# Patient Record
Sex: Female | Born: 1975 | Race: White | Hispanic: No | Marital: Married | Smoking: Former smoker
Health system: Southern US, Community
[De-identification: ages and names within clinical notes are randomized; demographics above are authoritative.]

## PROBLEM LIST (undated history)

## (undated) DIAGNOSIS — Z8489 Family history of other specified conditions: Secondary | ICD-10-CM

## (undated) DIAGNOSIS — F329 Major depressive disorder, single episode, unspecified: Secondary | ICD-10-CM

## (undated) DIAGNOSIS — F4310 Post-traumatic stress disorder, unspecified: Secondary | ICD-10-CM

## (undated) DIAGNOSIS — K219 Gastro-esophageal reflux disease without esophagitis: Secondary | ICD-10-CM

## (undated) DIAGNOSIS — R519 Headache, unspecified: Secondary | ICD-10-CM

## (undated) DIAGNOSIS — F32A Depression, unspecified: Secondary | ICD-10-CM

## (undated) DIAGNOSIS — D649 Anemia, unspecified: Secondary | ICD-10-CM

## (undated) DIAGNOSIS — R251 Tremor, unspecified: Secondary | ICD-10-CM

## (undated) DIAGNOSIS — I10 Essential (primary) hypertension: Secondary | ICD-10-CM

## (undated) HISTORY — PX: CHOLECYSTECTOMY: SHX55

---
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
## 2004-05-23 ENCOUNTER — Emergency Department: Payer: Self-pay | Admitting: Emergency Medicine

## 2004-06-18 ENCOUNTER — Emergency Department: Payer: Self-pay | Admitting: Emergency Medicine

## 2005-11-08 ENCOUNTER — Emergency Department: Payer: Self-pay | Admitting: Emergency Medicine

## 2006-09-01 ENCOUNTER — Emergency Department: Payer: Self-pay | Admitting: Emergency Medicine

## 2008-08-25 ENCOUNTER — Emergency Department: Payer: Self-pay | Admitting: Emergency Medicine

## 2008-10-19 ENCOUNTER — Emergency Department: Payer: Self-pay | Admitting: Unknown Physician Specialty

## 2008-12-31 ENCOUNTER — Inpatient Hospital Stay: Payer: Self-pay | Admitting: Psychiatry

## 2009-05-10 ENCOUNTER — Emergency Department: Payer: Self-pay | Admitting: Emergency Medicine

## 2009-05-13 ENCOUNTER — Emergency Department: Payer: Self-pay | Admitting: Internal Medicine

## 2009-10-18 ENCOUNTER — Emergency Department: Payer: Self-pay | Admitting: Internal Medicine

## 2009-11-11 ENCOUNTER — Emergency Department: Payer: Self-pay | Admitting: Emergency Medicine

## 2010-12-21 ENCOUNTER — Emergency Department: Payer: Self-pay | Admitting: Emergency Medicine

## 2012-11-01 ENCOUNTER — Emergency Department: Payer: Self-pay | Admitting: Emergency Medicine

## 2014-01-16 ENCOUNTER — Emergency Department: Payer: Self-pay | Admitting: Emergency Medicine

## 2014-01-16 LAB — BASIC METABOLIC PANEL
Anion Gap: 9 (ref 7–16)
BUN: 18 mg/dL (ref 7–18)
CREATININE: 0.86 mg/dL (ref 0.60–1.30)
Calcium, Total: 8.2 mg/dL — ABNORMAL LOW (ref 8.5–10.1)
Chloride: 110 mmol/L — ABNORMAL HIGH (ref 98–107)
Co2: 21 mmol/L (ref 21–32)
EGFR (Non-African Amer.): >60
Glucose: 91 mg/dL (ref 65–99)
Osmolality: 281 (ref 275–301)
Potassium: 3.5 mmol/L (ref 3.5–5.1)
Sodium: 140 mmol/L (ref 136–145)

## 2014-01-16 LAB — TROPONIN I: Troponin-I: <0.02 ng/mL

## 2014-01-16 LAB — CBC
HCT: 37.8 % (ref 35.0–47.0)
HGB: 12 g/dL (ref 12.0–16.0)
MCH: 29.1 pg (ref 26.0–34.0)
MCHC: 31.8 g/dL — ABNORMAL LOW (ref 32.0–36.0)
MCV: 92 fL (ref 80–100)
Platelet: 291 10*3/uL (ref 150–440)
RBC: 4.13 10*6/uL (ref 3.80–5.20)
RDW: 16.4 % — AB (ref 11.5–14.5)
WBC: 12.8 10*3/uL — ABNORMAL HIGH (ref 3.6–11.0)

## 2015-05-16 ENCOUNTER — Emergency Department
Admission: EM | Admit: 2015-05-16 | Discharge: 2015-05-16 | Disposition: A | Discharge status: Home / Self Care | Payer: Self-pay | Attending: Student | Admitting: Student

## 2015-05-16 ENCOUNTER — Emergency Department: Payer: Self-pay

## 2015-05-16 ENCOUNTER — Encounter: Payer: Self-pay | Admitting: Emergency Medicine

## 2015-05-16 DIAGNOSIS — F17200 Nicotine dependence, unspecified, uncomplicated: Secondary | ICD-10-CM | POA: Insufficient documentation

## 2015-05-16 DIAGNOSIS — M5412 Radiculopathy, cervical region: Secondary | ICD-10-CM | POA: Insufficient documentation

## 2015-05-16 MED ORDER — PREDNISONE 20 MG PO TABS
60.0000 mg | ORAL_TABLET | Freq: Once | ORAL | Status: AC
  Administered 2015-05-16: 60 mg via ORAL
  Filled 2015-05-16: qty 3

## 2015-05-16 MED ORDER — TRAMADOL HCL 50 MG PO TABS: 50.0000 mg | ORAL_TABLET | Freq: Four times a day (QID) | ORAL | Status: AC | PRN

## 2015-05-16 MED ORDER — METHYLPREDNISOLONE 4 MG PO TBPK: ORAL_TABLET | ORAL | Status: DC

## 2015-05-16 MED ORDER — TRAMADOL HCL 50 MG PO TABS
50.0000 mg | ORAL_TABLET | Freq: Once | ORAL | Status: AC
  Administered 2015-05-16: 50 mg via ORAL
  Filled 2015-05-16: qty 1

## 2015-05-16 NOTE — ED Provider Notes (Signed)
[facility] Emergency Department Provider Note  ____________________________________________  Time seen: Approximately 1:57 PM  I have reviewed the triage vital signs and the nursing notes.   HISTORY  Chief Complaint Neck Pain    HPI Misty George is a 40 y.o. female patient complain of neck pain that radiates to her left upper extremity. Patient stated pain increases with raising the arm and also right lateral movements of the neck. Patient also stated decreased strength in the left upper extremity. Patient stated no provocative incident accounting for this pain. Patient stated duration neck pain is greater than 3 months. But the radicular component started in the last 3 days. Patient has been using over-the-counter pain patches for her neck pain for at least 2 months. Patient described the pain as a shooting sensation. Patient states at present no pain secondary to wearing lidocaine pain patch. History reviewed. No pertinent past medical history.  There are no active problems to display for this patient.   History reviewed. No pertinent past surgical history.  Current Outpatient Rx  Name  Route  Sig  Dispense  Refill  . methylPREDNISolone (MEDROL DOSEPAK) 4 MG TBPK tablet      Take Tapered dose as directed   21 tablet   0   . traMADol (ULTRAM) 50 MG tablet   Oral   Take 1 tablet (50 mg total) by mouth every 6 (six) hours as needed.   20 tablet   0     Allergies Amoxicillin  History reviewed. No pertinent family history.  Social History Social History  Substance Use Topics  . Smoking status: Current Every Day Smoker  . Smokeless tobacco: None  . Alcohol Use: No    Review of Systems Constitutional: No fever/chills Eyes: No visual changes. ENT: No sore throat. Cardiovascular: Denies chest pain. Respiratory: Denies shortness of breath. Gastrointestinal: No abdominal pain.  No nausea, no vomiting.  No diarrhea.  No  constipation. Genitourinary: Negative for dysuria. Musculoskeletal: Radicular neck pain  Skin: Negative for rash. Neurological: Negative for headaches, focal weakness or numbness. Hematological/Lymphatic: Allergic/Immunilogical: Amoxicillin  10-point ROS otherwise negative.  ____________________________________________   PHYSICAL EXAM:  VITAL SIGNS: ED Triage Vitals  Enc Vitals Group     BP 05/16/15 1253 106/80 mmHg     Pulse Rate 05/16/15 1253 82     Resp 05/16/15 1253 18     Temp 05/16/15 1253 98.2 F (36.8 C)     Temp Source 05/16/15 1253 Oral     SpO2 05/16/15 1253 99 %     Weight --      Height --      Head Cir --      Peak Flow --      Pain Score 05/16/15 1253 0     Pain Loc --      Pain Edu? --      Excl. in [place]? --     Constitutional: Alert and oriented. Well appearing and in no acute distress. Eyes: Conjunctivae are normal. PERRL. EOMI. Head: Atraumatic. Nose: No congestion/rhinnorhea. Mouth/Throat: Mucous membranes are moist.  Oropharynx non-erythematous. Neck: No stridor.  No guarding with palpation C3-C5. Cardiovascular: Normal rate, regular rhythm. Grossly normal heart sounds.  Good peripheral circulation. Respiratory: Normal respiratory effort.  No retractions. Lungs CTAB. Gastrointestinal: Soft and nontender. No distention. No abdominal bruits. No CVA tenderness. Musculoskeletal: Decreased range of motion with abduction and overhead reaching the left upper extremity Neurologic:  Normal speech and language. No gross focal neurologic deficits are appreciated.  No gait instability. Skin:  Skin is warm, dry and intact. No rash noted. Psychiatric: Mood and affect are normal. Speech and behavior are normal.  ____________________________________________   LABS (all labs ordered are listed, but only abnormal results are displayed)  Labs Reviewed - No data to  display ____________________________________________  EKG   ____________________________________________  RADIOLOGY  X-rays show disc space loss between L5 and L6 was in plate spurring. ____________________________________________   PROCEDURES  Procedure(s) performed: None  Critical Care performed: No  ____________________________________________   INITIAL IMPRESSION / ASSESSMENT AND PLAN / ED COURSE  Pertinent labs & imaging results that were available during my care of the patient were reviewed by me and considered in my medical decision making (see chart for details).  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 Discussed x-ray findings with patient. Patient placed in arm sling. Patient will follow orthopedics. Patient given prescription for Medrol Dosepak and tramadol. ____________________________________________   FINAL CLINICAL IMPRESSION(S) / ED DIAGNOSES  Final diagnoses:  Cervical radicular pain      Sable Feil, PA-C 05/16/15 1433  Joanne Gavel, MD 05/16/15 1620

## 2015-05-16 NOTE — Discharge Instructions (Signed)
Cervical Radiculopathy Cervical radiculopathy means that a nerve in the neck is pinched or bruised. This can cause pain or loss of feeling (numbness) that runs from your neck to your arm and fingers. HOME CARE: Wear sling until evaluation by orthopedic Dr. Managing Pain  Take over-the-counter and prescription medicines only as told by your doctor.  If directed, put ice on the injured or painful area.  Put ice in a plastic bag.  Place a towel between your skin and the bag.  Leave the ice on for 20 minutes, 2-3 times per day.  If ice does not help, you can try using heat. Take a warm shower or warm bath, or use a heat pack as told by your doctor.  You may try a gentle neck and shoulder massage. Activity  Rest as needed. Follow instructions from your doctor about any activities to avoid.  Do exercises as told by your doctor or physical therapist. General Instructions   If you were given a soft collar, wear it as told by your doctor.  Use a flat pillow when you sleep.  Keep all follow-up visits as told by your doctor. This is important. GET HELP IF:  Your condition does not improve with treatment. GET HELP RIGHT AWAY IF:   Your pain gets worse and is not controlled with medicine.  You lose feeling or feel weak in your hand, arm, face, or leg.  You have a fever.  You have a stiff neck.  You cannot control when you poop or pee (have incontinence).  You have trouble with walking, balance, or talking.   This information is not intended to replace advice given to you by your health care provider. Make sure you discuss any questions you have with your health care provider.   Document Released: 01/21/2011 Document Revised: 10/23/2014 Document Reviewed: 03/28/2014 Elsevier Interactive Patient Education [employer].

## 2015-05-16 NOTE — ED Notes (Signed)
Pt to ed with c/o left sided neck pain and left arm pain that started this am.  Pt reports increased pain with ROM of left arm and left shoulder.  Pt also reports increased pain with ROM in neck.

## 2015-05-16 NOTE — ED Notes (Signed)
Numbness in left arm c/o sharp pain in her neck.  Also when she raises her are she has pain.  She denies any injury.  Pain in the same area any way that she turns her head.

## 2015-07-02 ENCOUNTER — Encounter: Payer: Self-pay | Admitting: Emergency Medicine

## 2015-07-02 ENCOUNTER — Emergency Department
Admission: EM | Admit: 2015-07-02 | Discharge: 2015-07-02 | Disposition: A | Discharge status: Home / Self Care | Payer: Self-pay | Attending: Emergency Medicine | Admitting: Emergency Medicine

## 2015-07-02 ENCOUNTER — Emergency Department: Payer: Self-pay

## 2015-07-02 DIAGNOSIS — R112 Nausea with vomiting, unspecified: Secondary | ICD-10-CM | POA: Insufficient documentation

## 2015-07-02 DIAGNOSIS — R197 Diarrhea, unspecified: Secondary | ICD-10-CM | POA: Insufficient documentation

## 2015-07-02 DIAGNOSIS — Z9049 Acquired absence of other specified parts of digestive tract: Secondary | ICD-10-CM | POA: Insufficient documentation

## 2015-07-02 DIAGNOSIS — F17200 Nicotine dependence, unspecified, uncomplicated: Secondary | ICD-10-CM | POA: Insufficient documentation

## 2015-07-02 DIAGNOSIS — R1013 Epigastric pain: Secondary | ICD-10-CM | POA: Insufficient documentation

## 2015-07-02 LAB — URINALYSIS COMPLETE WITH MICROSCOPIC (ARMC ONLY)
BACTERIA UA: NONE SEEN
Bilirubin Urine: NEGATIVE
GLUCOSE, UA: NEGATIVE mg/dL
HGB URINE DIPSTICK: NEGATIVE
Ketones, ur: NEGATIVE mg/dL
LEUKOCYTES UA: NEGATIVE
Nitrite: NEGATIVE
Protein, ur: NEGATIVE mg/dL
SPECIFIC GRAVITY, URINE: 1.002 — AB (ref 1.005–1.030)
pH: 6 (ref 5.0–8.0)

## 2015-07-02 LAB — CBC
HEMATOCRIT: 39.8 % (ref 35.0–47.0)
HEMOGLOBIN: 13.4 g/dL (ref 12.0–16.0)
MCH: 30.2 pg (ref 26.0–34.0)
MCHC: 33.8 g/dL (ref 32.0–36.0)
MCV: 89.5 fL (ref 80.0–100.0)
Platelets: 256 10*3/uL (ref 150–440)
RBC: 4.44 MIL/uL (ref 3.80–5.20)
RDW: 15.6 % — ABNORMAL HIGH (ref 11.5–14.5)
WBC: 10.1 10*3/uL (ref 3.6–11.0)

## 2015-07-02 LAB — COMPREHENSIVE METABOLIC PANEL
ALT: 17 U/L (ref 14–54)
AST: 17 U/L (ref 15–41)
Albumin: 4.1 g/dL (ref 3.5–5.0)
Alkaline Phosphatase: 50 U/L (ref 38–126)
Anion gap: 6 (ref 5–15)
BUN: 14 mg/dL (ref 6–20)
CHLORIDE: 110 mmol/L (ref 101–111)
CO2: 24 mmol/L (ref 22–32)
Calcium: 9.3 mg/dL (ref 8.9–10.3)
Creatinine, Ser: 0.51 mg/dL (ref 0.44–1.00)
Glucose, Bld: 83 mg/dL (ref 65–99)
POTASSIUM: 4 mmol/L (ref 3.5–5.1)
SODIUM: 140 mmol/L (ref 135–145)
Total Bilirubin: 0.3 mg/dL (ref 0.3–1.2)
Total Protein: 6.8 g/dL (ref 6.5–8.1)

## 2015-07-02 LAB — LIPASE, BLOOD: LIPASE: 20 U/L (ref 11–51)

## 2015-07-02 LAB — PREGNANCY, URINE: PREG TEST UR: NEGATIVE

## 2015-07-02 MED ORDER — OMEPRAZOLE 20 MG PO CPDR: 20.0000 mg | DELAYED_RELEASE_CAPSULE | Freq: Every day | ORAL | Status: DC

## 2015-07-02 MED ORDER — ONDANSETRON HCL 4 MG/2ML IJ SOLN
4.0000 mg | Freq: Once | INTRAMUSCULAR | Status: AC
  Administered 2015-07-02: 4 mg via INTRAVENOUS
  Filled 2015-07-02: qty 2

## 2015-07-02 MED ORDER — FAMOTIDINE 20 MG PO TABS
40.0000 mg | ORAL_TABLET | Freq: Once | ORAL | Status: AC
  Administered 2015-07-02: 40 mg via ORAL
  Filled 2015-07-02: qty 2

## 2015-07-02 MED ORDER — MORPHINE SULFATE (PF) 4 MG/ML IV SOLN
4.0000 mg | Freq: Once | INTRAVENOUS | Status: AC
  Administered 2015-07-02: 4 mg via INTRAVENOUS
  Filled 2015-07-02: qty 1

## 2015-07-02 MED ORDER — SODIUM CHLORIDE 0.9 % IV BOLUS (SEPSIS)
1000.0000 mL | Freq: Once | INTRAVENOUS | Status: AC
  Administered 2015-07-02: 1000 mL via INTRAVENOUS
  Filled 2015-07-02: qty 1000

## 2015-07-02 MED ORDER — GI COCKTAIL ~~LOC~~
30.0000 mL | ORAL | Status: AC
  Administered 2015-07-02: 30 mL via ORAL
  Filled 2015-07-02: qty 30

## 2015-07-02 MED ORDER — IOPAMIDOL (ISOVUE-300) INJECTION 61%
100.0000 mL | Freq: Once | INTRAVENOUS | Status: AC | PRN
  Administered 2015-07-02: 100 mL via INTRAVENOUS

## 2015-07-02 MED ORDER — METOCLOPRAMIDE HCL 10 MG PO TABS: 10.0000 mg | ORAL_TABLET | Freq: Three times a day (TID) | ORAL | Status: DC

## 2015-07-02 MED ORDER — ALUMINUM-MAGNESIUM-SIMETHICONE 200-200-20 MG/5ML PO SUSP: 30.0000 mL | Freq: Three times a day (TID) | ORAL | Status: DC

## 2015-07-02 MED ORDER — DIATRIZOATE MEGLUMINE & SODIUM 66-10 % PO SOLN
15.0000 mL | Freq: Once | ORAL | Status: AC
  Administered 2015-07-02: 15 mL via ORAL

## 2015-07-02 NOTE — Discharge Instructions (Signed)

## 2015-07-02 NOTE — ED Provider Notes (Signed)
[facility] Emergency Department Provider Note  ____________________________________________  Time seen: 10:20 AM  I have reviewed the triage vital signs and the nursing notes.   HISTORY  Chief Complaint Abdominal Pain    HPI Misty George is a 40 y.o. female who complains of epigastric pain with nausea vomiting diarrhea that started about 2 or 3 days ago. She is been treating this at home with Zantac without relief. 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 She recently moved to this area and this made an appointment with primary care for one month from now to establish care. She denies chest pain shortness of breath dizziness or syncope. No exertional or pleuritic symptoms. No fevers or chills.Pain is colicky in moderate intensity.  Difficulty with eating over the last few days due to the pain which gets worse when she eats.   History reviewed. No pertinent past medical history. Peptic ulcer disease  There are no active problems to display for this patient.    Past Surgical History  Procedure Laterality Date  . Cholecystectomy       Current Outpatient Rx  Name  Route  Sig  Dispense  Refill  . aluminum-magnesium hydroxide-simethicone (MAALOX) [id number] MG/5ML SUSP   Oral   Take 30 mLs by mouth 4 (four) times daily -  before meals and at bedtime.   355 mL   0   . methylPREDNISolone (MEDROL DOSEPAK) 4 MG TBPK tablet      Take Tapered dose as directed   21 tablet   0   . metoCLOPramide (REGLAN) 10 MG tablet   Oral   Take 1 tablet (10 mg total) by mouth 4 (four) times daily -  before meals and at bedtime.   60 tablet   0   . omeprazole (PRILOSEC) 20 MG capsule   Oral   Take 1 capsule (20 mg total) by mouth daily.   30 capsule   1   . traMADol (ULTRAM) 50 MG tablet   Oral   Take 1 tablet (50 mg total) by mouth every 6 (six) hours as needed.   20 tablet   0       Allergies Amoxicillin   No family history on file.  Social History Social History  Substance Use Topics  . Smoking status: Current Every Day Smoker  . Smokeless tobacco: None  . Alcohol Use: No    Review of Systems  Constitutional:   No fever or chills.  Eyes:   No vision changes.  ENT:   No sore throat. No rhinorrhea. Cardiovascular:   No chest pain. Respiratory:   No dyspnea or cough. Gastrointestinal:   Positive epigastric pain with vomiting and diarrhea as above.  Genitourinary:   Negative for dysuria or difficulty urinating. Musculoskeletal:   Negative for focal pain or swelling Neurological:   Negative for headaches 10-point ROS otherwise negative.  ____________________________________________   PHYSICAL EXAM:  VITAL SIGNS: ED Triage Vitals  Enc Vitals Group     BP 07/02/15 0945 111/79 mmHg     Pulse Rate 07/02/15 0945 97     Resp 07/02/15 0945 18     Temp 07/02/15 0945 97.8 F (36.6 C)     Temp Source 07/02/15 0945 Oral     SpO2 07/02/15 0945 100 %     Weight 07/02/15 0945 155 lb (70.308 kg)     Height 07/02/15 0945 5\' 3"  (1.6 m)     Head  Cir --      Peak Flow --      Pain Score 07/02/15 0945 7     Pain Loc --      Pain Edu? --      Excl. in [place]? --     Vital signs reviewed, nursing assessments reviewed.   Constitutional:   Alert and oriented. Well appearing and in no distress. Eyes:   No scleral icterus. No conjunctival pallor. PERRL. EOMI.  No nystagmus. ENT   Head:   Normocephalic and atraumatic.   Nose:   No congestion/rhinnorhea. No septal hematoma   Mouth/Throat:   MMM, no pharyngeal erythema. No peritonsillar mass.    Neck:   No stridor. No SubQ emphysema. No meningismus. Hematological/Lymphatic/Immunilogical:   No cervical lymphadenopathy. Cardiovascular:   RRR. Symmetric bilateral radial and DP pulses.  No murmurs.  Respiratory:   Normal respiratory effort without tachypnea nor retractions. Breath sounds are clear and  equal bilaterally. No wheezes/rales/rhonchi. Gastrointestinal:   Soft With epigastric and right lower quadrant tenderness. Non distended. There is no CVA tenderness.  No rebound, rigidity, or guarding. Genitourinary:   deferred Musculoskeletal:   Nontender with normal range of motion in all extremities. No joint effusions.  No lower extremity tenderness.  No edema. Neurologic:   Normal speech and language.  CN 2-10 normal. Motor grossly intact. No gross focal neurologic deficits are appreciated.  Skin:    Skin is warm, dry and intact. No rash noted.  No petechiae, purpura, or bullae.  ____________________________________________    LABS (pertinent positives/negatives) (all labs ordered are listed, but only abnormal results are displayed) Labs Reviewed  CBC - Abnormal; Notable for the following:    RDW 15.6 (*)    All other components within normal limits  URINALYSIS COMPLETEWITH MICROSCOPIC ([facility] ONLY) - Abnormal; Notable for the following:    Color, Urine STRAW (*)    APPearance CLEAR (*)    Specific Gravity, Urine 1.002 (*)    Squamous Epithelial / LPF 0-5 (*)    All other components within normal limits  LIPASE, BLOOD  COMPREHENSIVE METABOLIC PANEL  PREGNANCY, URINE   ____________________________________________   EKG  Interpreted by me  Date: 07/02/2015  Rate: 98  Rhythm: normal sinus rhythm  QRS Axis: normal  Intervals: normal  ST/T Wave abnormalities: normal  Conduction Disutrbances: none  Narrative Interpretation: unremarkable      ____________________________________________    RADIOLOGY  CT abdomen and pelvis unremarkable  ____________________________________________   PROCEDURES   ____________________________________________   INITIAL IMPRESSION / ASSESSMENT AND PLAN / ED COURSE  Pertinent labs & imaging results that were available during my care of the patient were reviewed by me and considered in my medical decision making (see chart for  details).  Patient presents with epigastric pain, concerning for pancreatitis versus gastritis. Low suspicion for perforation or obstruction, torsion STI PID. Labs were unremarkable so I proceeded with CT scan which was also negative. Patient was feeling better after GI cocktail and antacids. She is tolerating oral intake. Vital signs are stable. We'll discharge her home to follow up with primary care, omeprazole Reglan and Maalox for now.     ____________________________________________   FINAL CLINICAL IMPRESSION(S) / ED DIAGNOSES  Final diagnoses:  Epigastric pain       Portions of this note were generated with dragon dictation software. Dictation errors may occur despite best attempts at proofreading.   Carrie Mew, MD 07/02/15 [phone]

## 2015-07-02 NOTE — ED Notes (Signed)
Epigastric abd pain x4 days, vomiting

## 2015-07-02 NOTE — ED Notes (Signed)
Pt. Reports Medial Upper epigastric pain associated with N/V/D since Sunday. Pt. Has been tx at home with OTC without relief. Pt. Reports a brown or red tinge emesis. Pt. Able to tolerate minimal soup and fluids.

## 2018-07-27 ENCOUNTER — Emergency Department
Admission: EM | Admit: 2018-07-27 | Discharge: 2018-07-27 | Disposition: A | Discharge status: Home / Self Care | Payer: BC Managed Care – PPO | Attending: Emergency Medicine | Admitting: Emergency Medicine

## 2018-07-27 ENCOUNTER — Encounter: Payer: Self-pay | Admitting: Emergency Medicine

## 2018-07-27 ENCOUNTER — Emergency Department: Payer: BC Managed Care – PPO

## 2018-07-27 ENCOUNTER — Other Ambulatory Visit: Payer: Self-pay

## 2018-07-27 DIAGNOSIS — Y9389 Activity, other specified: Secondary | ICD-10-CM | POA: Insufficient documentation

## 2018-07-27 DIAGNOSIS — W540XXA Bitten by dog, initial encounter: Secondary | ICD-10-CM | POA: Diagnosis not present

## 2018-07-27 DIAGNOSIS — S61452A Open bite of left hand, initial encounter: Secondary | ICD-10-CM

## 2018-07-27 DIAGNOSIS — S81051A Open bite, right knee, initial encounter: Secondary | ICD-10-CM

## 2018-07-27 DIAGNOSIS — F17200 Nicotine dependence, unspecified, uncomplicated: Secondary | ICD-10-CM | POA: Insufficient documentation

## 2018-07-27 DIAGNOSIS — Y92008 Other place in unspecified non-institutional (private) residence as the place of occurrence of the external cause: Secondary | ICD-10-CM | POA: Insufficient documentation

## 2018-07-27 DIAGNOSIS — Y999 Unspecified external cause status: Secondary | ICD-10-CM | POA: Insufficient documentation

## 2018-07-27 DIAGNOSIS — S51851A Open bite of right forearm, initial encounter: Secondary | ICD-10-CM

## 2018-07-27 DIAGNOSIS — S51831A Puncture wound without foreign body of right forearm, initial encounter: Secondary | ICD-10-CM | POA: Diagnosis not present

## 2018-07-27 DIAGNOSIS — Z79899 Other long term (current) drug therapy: Secondary | ICD-10-CM | POA: Diagnosis not present

## 2018-07-27 DIAGNOSIS — S81031A Puncture wound without foreign body, right knee, initial encounter: Secondary | ICD-10-CM | POA: Insufficient documentation

## 2018-07-27 DIAGNOSIS — S61431A Puncture wound without foreign body of right hand, initial encounter: Secondary | ICD-10-CM | POA: Diagnosis not present

## 2018-07-27 MED ORDER — METRONIDAZOLE 500 MG PO TABS: 500.0000 mg | ORAL_TABLET | Freq: Four times a day (QID) | ORAL | 0 refills | Status: DC

## 2018-07-27 MED ORDER — CIPROFLOXACIN HCL 500 MG PO TABS
500.0000 mg | ORAL_TABLET | Freq: Once | ORAL | Status: AC
  Administered 2018-07-27: 500 mg via ORAL
  Filled 2018-07-27: qty 1

## 2018-07-27 MED ORDER — HYDROCODONE-ACETAMINOPHEN 5-325 MG PO TABS
1.0000 | ORAL_TABLET | Freq: Once | ORAL | Status: AC
  Administered 2018-07-27: 1 via ORAL
  Filled 2018-07-27: qty 1

## 2018-07-27 MED ORDER — CIPROFLOXACIN HCL 500 MG PO TABS: 500.0000 mg | ORAL_TABLET | Freq: Two times a day (BID) | ORAL | 0 refills | Status: AC

## 2018-07-27 MED ORDER — NAPROXEN 500 MG PO TABS
500.0000 mg | ORAL_TABLET | Freq: Once | ORAL | Status: AC
  Administered 2018-07-27: 500 mg via ORAL
  Filled 2018-07-27: qty 1

## 2018-07-27 MED ORDER — METRONIDAZOLE IN NACL 5-0.79 MG/ML-% IV SOLN: 500.0000 mg | Freq: Once | INTRAVENOUS | Status: DC

## 2018-07-27 MED ORDER — HYDROCODONE-ACETAMINOPHEN 5-325 MG PO TABS: 1.0000 | ORAL_TABLET | ORAL | 0 refills | Status: DC | PRN

## 2018-07-27 MED ORDER — METRONIDAZOLE 500 MG PO TABS
500.0000 mg | ORAL_TABLET | Freq: Once | ORAL | Status: AC
  Administered 2018-07-27: 500 mg via ORAL
  Filled 2018-07-27: qty 1

## 2018-07-27 MED ORDER — NAPROXEN 500 MG PO TABS: 500.0000 mg | ORAL_TABLET | Freq: Two times a day (BID) | ORAL | 0 refills | Status: DC

## 2018-07-27 NOTE — Discharge Instructions (Addendum)
Keep the wounds clean and dry.  The Steri-Strips will fall off on their own.  Do not apply any ointments while they are in place.  Take the antibiotic as prescribed and until finished.  Return to the emergency department if you feel that there is any infection.

## 2018-07-27 NOTE — ED Triage Notes (Signed)
Pt reports she was attacked by neighbors dog this evening leaving 3 puncture wounds to the right knee/upper thigh, 1 puncture to the right forearm, 1 puncture to the left thumb and multiple abrasions to the same areas. Pt sts attack was reported to local [place] department.

## 2018-07-27 NOTE — ED Provider Notes (Signed)
[facility] Emergency Department Provider Note  ____________________________________________  Time seen: Approximately 8:15 PM  I have reviewed the triage vital signs and the nursing notes.   HISTORY  Chief Complaint Animal Bite   HPI Misty George is a 43 y.o. female who presents to the emergency department after being bitten by the neighbor's dog.  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  She states that today the dog broke free from its chain and as she was taking her dog inside it decided to attack.  She states she has a great Dane.  She jumped on her dogs back try  and get away, but the dog bit her.  She states that she feels as if her right knee is "crushed."  She also states that she has wounds to the right and left upper extremities.  She also reports that she had a nosebleed but is not sure exactly how that happened.  Animal control is involved.  Tdap is current.  History reviewed. No pertinent past medical history.  There are no active problems to display for this patient.   Past Surgical History:  Procedure Laterality Date  . CHOLECYSTECTOMY      Prior to Admission medications   Medication Sig Start Date End Date Taking? Authorizing Provider  aluminum-magnesium hydroxide-simethicone (MAALOX) 563-149-70 MG/5ML SUSP Take 30 mLs by mouth 4 (four) times daily -  before meals and at bedtime. 07/02/15   Carrie Mew, MD  ciprofloxacin (CIPRO) 500 MG tablet Take 1 tablet (500 mg total) by mouth 2 (two) times daily for 10 days. 07/27/18 08/06/18  Omayra Tulloch, Johnette Abraham B, FNP  HYDROcodone-acetaminophen (NORCO/VICODIN) 5-325 MG tablet Take 1 tablet by mouth every 4 (four) hours as needed for moderate pain. 07/27/18 07/27/19  Sherrie George B, FNP  methylPREDNISolone (MEDROL DOSEPAK) 4 MG TBPK tablet Take Tapered dose as directed 05/16/15   Sable Feil, PA-C  metoCLOPramide (REGLAN) 10 MG tablet Take 1 tablet (10 mg total) by  mouth 4 (four) times daily -  before meals and at bedtime. 07/02/15   Carrie Mew, MD  metroNIDAZOLE (FLAGYL) 500 MG tablet Take 1 tablet (500 mg total) by mouth 4 (four) times daily. 07/27/18   Teosha Casso, Johnette Abraham B, FNP  naproxen (NAPROSYN) 500 MG tablet Take 1 tablet (500 mg total) by mouth 2 (two) times daily with a meal. 07/27/18   Dontea Corlew B, FNP  omeprazole (PRILOSEC) 20 MG capsule Take 1 capsule (20 mg total) by mouth daily. 07/02/15 07/01/16  Carrie Mew, MD    Allergies Amoxicillin  History reviewed. No pertinent family history.  Social History Social History   Tobacco Use  . Smoking status: Current Every Day Smoker  . Smokeless tobacco: Never Used  Substance Use Topics  . Alcohol use: No  . Drug use: No    Review of Systems  Constitutional: Negative for fever. Respiratory: Negative for cough or shortness of breath.  Musculoskeletal: Negative for myalgias Skin: Positive for puncture wounds and abrasions to the extremities. Neurological: Negative for numbness or paresthesias. ____________________________________________   PHYSICAL EXAM:  VITAL SIGNS: ED Triage Vitals [07/27/18 1910]  Enc Vitals Group     BP 124/84     Pulse Rate (!) 133     Resp 20     Temp 98 F (36.7 C)     Temp Source Oral     SpO2 97 %     Weight      Height  Head Circumference      Peak Flow      Pain Score      Pain Loc      Pain Edu?      Excl. in [place]?      Constitutional: Well appearing. Eyes: Conjunctivae are clear without discharge or drainage. Nose: No rhinorrhea noted. Mouth/Throat: Airway is patent.  Neck: No stridor. Unrestricted range of motion observed. Cardiovascular: Capillary refill is <3 seconds.  Respiratory: Respirations are even and unlabored.. Musculoskeletal: Right knee pain increases with attempt at full extension.  No obvious deformity. Neurologic: Awake, alert, and oriented x 4.  Skin: Laceration/puncture wound noted to the medial and  lateral aspect of the right knee, right forearm, and right hand.  Scattered abrasions and early ecchymosis is noted to all extremities.  ____________________________________________   LABS (all labs ordered are listed, but only abnormal results are displayed)  Labs Reviewed - No data to display ____________________________________________  EKG  Not indicated. ____________________________________________  RADIOLOGY  Images of the right knee shows no osseous abnormality or radiopaque foreign body per radiology. ____________________________________________   PROCEDURES  Procedures  Wounds of the right forearm, left hand, and right knee were all cleaned with Hibiclens and normal saline.  Steri-Strips were applied to the gaping wounds on the left left hand and right knee. ____________________________________________   INITIAL IMPRESSION / ASSESSMENT AND PLAN / ED COURSE  Misty George is a 43 y.o. female presenting to the emergency department after being bitten by the neighbor's dog.   X-ray of the right knee is reassuring and negative for bony abnormality or retained foreign body.  Wounds were cleaned with Hibiclens and normal saline as described above.  Patient has an anaphylactic reaction to amoxicillin, therefore she will be treated with ciprofloxacin and Flagyl.  She will also be given Naprosyn and hydrocodone for pain control.  Patient was strongly advised to return to the emergency department if she begins to feel that she has any sign of infection from the wounds.  She was also advised to keep in touch with animal control and return to the emergency department if they deem it necessary for her to have the rabies series initiated.  Medications  HYDROcodone-acetaminophen (NORCO/VICODIN) 5-325 MG per tablet 1 tablet (1 tablet Oral Given 07/27/18 2041)  naproxen (NAPROSYN) tablet 500 mg (500 mg Oral Given 07/27/18 2041)  ciprofloxacin (CIPRO) tablet 500 mg (500 mg Oral Given  07/27/18 2159)  metroNIDAZOLE (FLAGYL) tablet 500 mg (500 mg Oral Given 07/27/18 2159)     Pertinent labs & imaging results that were available during my care of the patient were reviewed by me and considered in my medical decision making (see chart for details).  ____________________________________________   FINAL CLINICAL IMPRESSION(S) / ED DIAGNOSES  Final diagnoses:  Dog bite of right knee, initial encounter  Dog bite of right forearm, initial encounter  Dog bite of left hand, initial encounter    ED Discharge Orders         Ordered    HYDROcodone-acetaminophen (NORCO/VICODIN) 5-325 MG tablet  Every 4 hours PRN     07/27/18 2141    naproxen (NAPROSYN) 500 MG tablet  2 times daily with meals     07/27/18 2141    ciprofloxacin (CIPRO) 500 MG tablet  2 times daily     07/27/18 2145    metroNIDAZOLE (FLAGYL) 500 MG tablet  4 times daily     07/27/18 2145           Note:  This document was prepared using Dragon voice recognition software and may include unintentional dictation errors.   Victorino Dike, FNP 07/29/18 0028    Schuyler Amor, MD 08/02/18 [phone]

## 2018-08-21 NOTE — H&P (Signed)
Misty George is a 43 y.o. female here for [place] . MistySatterfieldis a 43 y.o.female.pt here for f/up  for AUB . Pt states that she has a long h/o irregular bleeding and bleeds 2x/ week . + clot passage . Long h/o of dyspareunia . Marland Kitchen She was placed on OCPs for this bleeding one month ago without change in bleeding .She does not desire future fertility  Ferritin= 19   embx -proliferative   pap = pending  Saline infusion u/s  : Two fibroids seen 1 lt ant=35 mm 2 rt ant= 17 mm   Endometrium=10.23 mm   Rt ov wnl  Lt septated ov cyst= 1.99 cm Septation=0.17 cm  Saline infusion sonohysterography: betadine prep to the cervix followed by placement of the HSG catheter into the endometrial canal . Sterile H2O is injected while performing a transvaginal u/s . Findings:no endometrial masses .    Past Medical History:  has a past medical history of Allergy and Anxiety.  Past Surgical History:  has a past surgical history that includes Cholecystectomy and Laparoscopic cholecystectomy. Family History: family history includes Alcohol abuse in her maternal grandfather and paternal grandfather; Bipolar disorder in her mother; Depression in her maternal grandmother; High blood pressure (Hypertension) in her father, maternal grandmother, and paternal grandmother; Hip fracture in her maternal grandmother and paternal grandmother; Hyperlipidemia (Elevated cholesterol) in her mother; Myocardial Infarction (Heart attack) in her maternal grandfather, maternal grandmother, and paternal grandmother; Osteoporosis (Thinning of bones) in her mother; Rheum arthritis in her father; Stroke in her maternal grandmother and paternal grandmother. Social History:  reports that she quit smoking about 3 years ago. Her smoking use included cigarettes. She uses smokeless tobacco. She reports current alcohol use. She reports previous drug use. OB/GYN History:          OB History    Gravida  1   Para  1    Term      Preterm      AB      Living  1     SAB      TAB      Ectopic      Molar      Multiple      Live Births  1          Allergies: is allergic to amoxicillin and meloxicam. Medications:  Current Outpatient Medications:  .  cinnamon bark 500 mg capsule, Take 1,000 mg by mouth 2 (two) times daily, Disp: , Rfl:  .  cyanocobalamin (VITAMIN B12) 1000 MCG tablet, Take 1 tablet (1,000 mcg total) by mouth once daily, Disp: 30 tablet, Rfl: 11 .  cyclobenzaprine (FLEXERIL) 5 MG tablet, Take 1 tablet (5 mg total) by mouth 2 (two) times daily as needed for Muscle spasms, Disp: 30 tablet, Rfl: 0 .  docusate (COLACE) 100 MG capsule, Take 50 mg by mouth once daily, Disp: , Rfl:  .  escitalopram oxalate (LEXAPRO) 10 MG tablet, Take 1 tablet (10 mg total) by mouth once daily for 90 days, Disp: 30 tablet, Rfl: 2 .  gabapentin (NEURONTIN) 100 MG capsule, Take one capsule ( 100 mg) twice a day for one week then increase to two capsules ( 200 mg) twice a day for tremor., Disp: 120 capsule, Rfl: 3 .  milk thistle 175 mg tablet, Take 175 mg by mouth once daily, Disp: , Rfl:  .  omeprazole (PRILOSEC) 20 MG DR capsule, Take 20 mg by mouth once daily, Disp: , Rfl:  .  phentermine (  ADIPEX-P) 37.5 mg tablet, Take 1 tablet (37.5 mg total) by mouth every morning before breakfast, Disp: 30 tablet, Rfl: 0 .  norgestimate-ethinyl estradioL (SPRINTEC 0.25/35, 28,) 0.25-35 mg-mcg tablet, Take 1 tablet by mouth once daily (Patient not taking: Reported on 07/19/2018  ), Disp: 1 Package, Rfl: 11  Review of Systems: General:                      No fatigue or weight loss Eyes:                           No vision changes Ears:                            No hearing difficulty Respiratory:                No cough or shortness of breath Pulmonary:                  No asthma or shortness of breath Cardiovascular:           No chest pain, palpitations, dyspnea on exertion Gastrointestinal:          No  abdominal bloating, chronic diarrhea, constipations, masses, pain or hematochezia Genitourinary:             No hematuria, dysuria, abnormal vaginal discharge, pelvic pain, +Menometrorrhagia, = dyspareunia Lymphatic:                   No swollen lymph nodes Musculoskeletal:         No muscle weakness Neurologic:                  No extremity weakness, syncope, seizure disorder Psychiatric:                  No history of depression, delusions or suicidal/homicidal ideation    Exam:      Vitals:   08/21/2018  BP: (!) 133/99  Pulse: (!) 113    Body mass index is 27.28 kg/m.  WDWN white/  female in NAD   Lungs: CTA  CV : RRR without murmur    Neck:  no thyromegaly Abdomen: soft , no mass, normal active bowel sounds,  non-tender, no rebound tenderness Pelvic: tanner stage 5 ,  External genitalia: vulva /labia no lesions Urethra: no prolapse Vagina: normal physiologic d/c, adequate room for TVH  Cervix: no lesions, no cervical motion tenderness   Uterus: normal size shape and contour, non-tender Adnexa: no mass,  non-tender   Rectovaginal:   Impression:   The primary encounter diagnosis was Menorrhagia with irregular cycle. A diagnosis of Dyspareunia, female was also pertinent to this visit.    Plan:   I have spoken with the patient regarding treatment options including expectant management, hormonal options, or surgical intervention. Spoke specifically about the use of a mirena ,Depo Provera and endometrial ablation   After a full discussion the pt elects to proceed with TVH and bilateral salpingectomy  Benefits and risks to surgery: The proposed benefit of the surgery has been discussed with the patient. The possible risks include, but are not limited to: organ injury to the bowel , bladder, ureters, and major blood vessels and nerves. There is a possibility of additional surgeries resulting from these injuries. There is also the risk of blood transfusion and  the need to receive blood products during or  after the procedure which may rarely lead to HIV or Hepatitis C infection. There is a risk of developing a deep venous thrombosis or a pulmonary embolism . There is the possibility of wound infection and also anesthetic complications, even the rare possibility of death. The patient understands these risks and wishes to proceed. All questions have been answered and the consent has been signed.    Caroline Sauger, MD

## 2018-08-30 ENCOUNTER — Other Ambulatory Visit: Payer: Self-pay

## 2018-08-30 ENCOUNTER — Encounter
Admission: RE | Admit: 2018-08-30 | Discharge: 2018-08-30 | Disposition: A | Discharge status: Home / Self Care | Payer: BC Managed Care – PPO | Source: Ambulatory Visit | Attending: Obstetrics and Gynecology | Admitting: Obstetrics and Gynecology

## 2018-08-30 DIAGNOSIS — Z01812 Encounter for preprocedural laboratory examination: Secondary | ICD-10-CM | POA: Diagnosis not present

## 2018-08-30 HISTORY — DX: Essential (primary) hypertension: I10

## 2018-08-30 HISTORY — DX: Headache, unspecified: R51.9

## 2018-08-30 HISTORY — DX: Depression, unspecified: F32.A

## 2018-08-30 HISTORY — DX: Post-traumatic stress disorder, unspecified: F43.10

## 2018-08-30 HISTORY — DX: Gastro-esophageal reflux disease without esophagitis: K21.9

## 2018-08-30 HISTORY — DX: Tremor, unspecified: R25.1

## 2018-08-30 HISTORY — DX: Family history of other specified conditions: Z84.89

## 2018-08-30 HISTORY — DX: Anemia, unspecified: D64.9

## 2018-08-30 NOTE — Patient Instructions (Signed)
Your procedure is scheduled on: 09-04-18 MONDAY Report to Same Day Surgery 2nd floor medical mall [facility] Entrance-take elevator on left to 2nd floor.  Check in with surgery information desk.) To find out your arrival time please call [phone] between 1PM - 3PM on 09-01-18 FRIDAY  Remember: Instructions that are not followed completely may result in serious medical risk, up to and including death, or upon the discretion of your surgeon and anesthesiologist your surgery may need to be rescheduled.    _x___ 1. Do not eat food after midnight the night before your procedure. NO GUM OR CANDY AFTER MIDNIGHT. You may drink clear liquids up to 2 hours before you are scheduled to arrive at the hospital for your procedure.  Do not drink clear liquids within 2 hours of your scheduled arrival to the hospital.  Clear liquids include  --Water or Apple juice without pulp  --Gatorade  --Black Coffee or Clear Tea (No milk, no creamers, do not add anything to  the coffee or Tea   ____Ensure clear carbohydrate drink on the way to the hospital for bariatric patients  _X___Ensure clear carbohydrate drink 3 hours before surgery      __x__ 2. No Alcohol for 24 hours before or after surgery.   __x__3. No Smoking or e-cigarettes for 24 prior to surgery.  Do not use any chewable tobacco products for at least 6 hour prior to surgery   ____  4. Bring all medications with you on the day of surgery if instructed.    __x__ 5. Notify your doctor if there is any change in your medical condition     (cold, fever, infections).    x___6. On the morning of surgery brush your teeth with toothpaste and water.  You may rinse your mouth with mouth wash if you wish.  Do not swallow any toothpaste or mouthwash.   Do not wear jewelry, make-up, hairpins, clips or nail polish.  Do not wear lotions, powders, or perfumes. You may wear deodorant.  Do not shave 48 hours prior to surgery. Men may shave face and neck.  Do  not bring valuables to the hospital.    [facility] is not responsible for any belongings or valuables.               Contacts, dentures or bridgework may not be worn into surgery.  Leave your suitcase in the car. After surgery it may be brought to your room.  For patients admitted to the hospital, discharge time is determined by your treatment team.  _  Patients discharged the day of surgery will not be allowed to drive home.  You will need someone to drive you home and stay with you the night of your procedure.    Please read over the following fact sheets that you were given:   [facility] Preparing for Surgery   _x___ TAKE THE FOLLOWING MEDICATION THE MORNING OF SURGERY WITH A SMALL SIP OF WATER. These include:  1. LEXAPRO (ESCITALOPRAM)  2. GABAPENTIN (NEURONTIN)  3. PRILOSEC (OMEPRAZOLE)  4. TAKE AN EXTRA PRILOSEC THE NIGHT BEFORE YOUR SURGERY  5.  6.  ____Fleets enema or Magnesium Citrate as directed.   ____ Use CHG Soap or sage wipes as directed on instruction sheet   ____ Use inhalers on the day of surgery and bring to hospital day of surgery  ____ Stop Metformin and Janumet 2 days prior to surgery.    ____ Take 1/2 of usual insulin dose the night  before surgery and none on the morning surgery.   ____ Follow recommendations from Cardiologist, Pulmonologist or PCP regarding stopping Aspirin, Coumadin, Plavix ,Eliquis, Effient, or Pradaxa, and Pletal.  X____Stop Anti-inflammatories such as Advil, Aleve, Ibuprofen, Motrin, Naproxen, Naprosyn, Goodies powders or aspirin products NOW-OK to take Tylenol OR HYDROCODONE IF NEEDED   ____ Stop supplements until after surgery.    ____ Bring C-Pap to the hospital.

## 2018-08-31 ENCOUNTER — Other Ambulatory Visit: Payer: Self-pay

## 2018-08-31 ENCOUNTER — Other Ambulatory Visit
Admission: RE | Admit: 2018-08-31 | Discharge: 2018-08-31 | Disposition: A | Discharge status: Home / Self Care | Payer: BC Managed Care – PPO | Source: Ambulatory Visit | Attending: Obstetrics and Gynecology | Admitting: Obstetrics and Gynecology

## 2018-08-31 DIAGNOSIS — N920 Excessive and frequent menstruation with regular cycle: Secondary | ICD-10-CM | POA: Insufficient documentation

## 2018-08-31 DIAGNOSIS — Z01812 Encounter for preprocedural laboratory examination: Secondary | ICD-10-CM | POA: Diagnosis not present

## 2018-08-31 DIAGNOSIS — Z1159 Encounter for screening for other viral diseases: Secondary | ICD-10-CM | POA: Insufficient documentation

## 2018-08-31 LAB — BASIC METABOLIC PANEL
Anion gap: 10 (ref 5–15)
BUN: 22 mg/dL — ABNORMAL HIGH (ref 6–20)
CO2: 26 mmol/L (ref 22–32)
Calcium: 8.9 mg/dL (ref 8.9–10.3)
Chloride: 103 mmol/L (ref 98–111)
Creatinine, Ser: 0.67 mg/dL (ref 0.44–1.00)
GFR calc Af Amer: >60 mL/min (ref >60)
GFR calc non Af Amer: >60 mL/min (ref >60)
Glucose, Bld: 87 mg/dL (ref 70–99)
Potassium: 3.8 mmol/L (ref 3.5–5.1)
Sodium: 139 mmol/L (ref 135–145)

## 2018-08-31 LAB — CBC
HCT: 38.4 % (ref 36.0–46.0)
Hemoglobin: 12.5 g/dL (ref 12.0–15.0)
MCH: 31.7 pg (ref 26.0–34.0)
MCHC: 32.6 g/dL (ref 30.0–36.0)
MCV: 97.5 fL (ref 80.0–100.0)
Platelets: 274 10*3/uL (ref 150–400)
RBC: 3.94 MIL/uL (ref 3.87–5.11)
RDW: 13.5 % (ref 11.5–15.5)
WBC: 10.1 10*3/uL (ref 4.0–10.5)
nRBC: 0 % (ref 0.0–0.2)

## 2018-08-31 LAB — TYPE AND SCREEN
ABO/RH(D): O POS
Antibody Screen: NEGATIVE

## 2018-08-31 LAB — SARS CORONAVIRUS 2 (TAT 6-24 HRS): SARS Coronavirus 2: NEGATIVE

## 2018-09-03 MED ORDER — CLINDAMYCIN PHOSPHATE 900 MG/50ML IV SOLN
900.0000 mg | INTRAVENOUS | Status: AC
  Administered 2018-09-04: 900 mg via INTRAVENOUS

## 2018-09-03 MED ORDER — GENTAMICIN SULFATE 40 MG/ML IJ SOLN
5.0000 mg/kg | INTRAVENOUS | Status: AC
  Administered 2018-09-04: 300 mg via INTRAVENOUS
  Filled 2018-09-03: qty 7.5

## 2018-09-04 ENCOUNTER — Ambulatory Visit: Payer: BC Managed Care – PPO | Admitting: Registered Nurse

## 2018-09-04 ENCOUNTER — Encounter: Payer: Self-pay | Admitting: *Deleted

## 2018-09-04 ENCOUNTER — Observation Stay
Admission: RE | Admit: 2018-09-04 | Discharge: 2018-09-04 | Disposition: A | Discharge status: Home / Self Care | Payer: BC Managed Care – PPO | Attending: Obstetrics and Gynecology | Admitting: Obstetrics and Gynecology

## 2018-09-04 ENCOUNTER — Other Ambulatory Visit: Payer: Self-pay

## 2018-09-04 ENCOUNTER — Encounter
Admission: RE | Disposition: A | Discharge status: Home / Self Care | Payer: Self-pay | Source: Home / Self Care | Attending: Obstetrics and Gynecology

## 2018-09-04 DIAGNOSIS — Z9889 Other specified postprocedural states: Secondary | ICD-10-CM

## 2018-09-04 DIAGNOSIS — F329 Major depressive disorder, single episode, unspecified: Secondary | ICD-10-CM | POA: Insufficient documentation

## 2018-09-04 DIAGNOSIS — I10 Essential (primary) hypertension: Secondary | ICD-10-CM | POA: Insufficient documentation

## 2018-09-04 DIAGNOSIS — N838 Other noninflammatory disorders of ovary, fallopian tube and broad ligament: Secondary | ICD-10-CM | POA: Diagnosis not present

## 2018-09-04 DIAGNOSIS — F419 Anxiety disorder, unspecified: Secondary | ICD-10-CM | POA: Insufficient documentation

## 2018-09-04 DIAGNOSIS — N920 Excessive and frequent menstruation with regular cycle: Principal | ICD-10-CM | POA: Insufficient documentation

## 2018-09-04 DIAGNOSIS — Z87891 Personal history of nicotine dependence: Secondary | ICD-10-CM | POA: Insufficient documentation

## 2018-09-04 DIAGNOSIS — D251 Intramural leiomyoma of uterus: Secondary | ICD-10-CM | POA: Diagnosis not present

## 2018-09-04 DIAGNOSIS — F4310 Post-traumatic stress disorder, unspecified: Secondary | ICD-10-CM | POA: Insufficient documentation

## 2018-09-04 DIAGNOSIS — K219 Gastro-esophageal reflux disease without esophagitis: Secondary | ICD-10-CM | POA: Diagnosis not present

## 2018-09-04 DIAGNOSIS — Z79899 Other long term (current) drug therapy: Secondary | ICD-10-CM | POA: Diagnosis not present

## 2018-09-04 HISTORY — PX: VAGINAL HYSTERECTOMY: SHX2639

## 2018-09-04 LAB — CBC
HCT: 38.3 % (ref 36.0–46.0)
Hemoglobin: 12.8 g/dL (ref 12.0–15.0)
MCH: 31.8 pg (ref 26.0–34.0)
MCHC: 33.4 g/dL (ref 30.0–36.0)
MCV: 95 fL (ref 80.0–100.0)
Platelets: 256 10*3/uL (ref 150–400)
RBC: 4.03 MIL/uL (ref 3.87–5.11)
RDW: 13.8 % (ref 11.5–15.5)
WBC: 17.3 10*3/uL — ABNORMAL HIGH (ref 4.0–10.5)
nRBC: 0 % (ref 0.0–0.2)

## 2018-09-04 LAB — ABO/RH: ABO/RH(D): O POS

## 2018-09-04 LAB — POCT PREGNANCY, URINE: Preg Test, Ur: NEGATIVE

## 2018-09-04 SURGERY — HYSTERECTOMY, VAGINAL
Anesthesia: General | Site: Vagina

## 2018-09-04 MED ORDER — OXYCODONE-ACETAMINOPHEN 5-325 MG PO TABS: 1.0000 | ORAL_TABLET | Freq: Four times a day (QID) | ORAL | 0 refills | Status: AC | PRN

## 2018-09-04 MED ORDER — CLINDAMYCIN PHOSPHATE 900 MG/50ML IV SOLN
INTRAVENOUS | Status: AC
  Filled 2018-09-04: qty 50

## 2018-09-04 MED ORDER — FENTANYL CITRATE (PF) 250 MCG/5ML IJ SOLN
INTRAMUSCULAR | Status: AC
  Filled 2018-09-04: qty 5

## 2018-09-04 MED ORDER — FENTANYL CITRATE (PF) 100 MCG/2ML IJ SOLN
INTRAMUSCULAR | Status: DC | PRN
  Administered 2018-09-04: 25 ug via INTRAVENOUS
  Administered 2018-09-04: 100 ug via INTRAVENOUS
  Administered 2018-09-04 (×3): 25 ug via INTRAVENOUS

## 2018-09-04 MED ORDER — OXYCODONE HCL 5 MG/5ML PO SOLN: 5.0000 mg | Freq: Once | ORAL | Status: DC | PRN

## 2018-09-04 MED ORDER — PROPOFOL 10 MG/ML IV BOLUS
INTRAVENOUS | Status: DC | PRN
  Administered 2018-09-04: 130 mg via INTRAVENOUS

## 2018-09-04 MED ORDER — LACTATED RINGERS IV SOLN: INTRAVENOUS | Status: DC

## 2018-09-04 MED ORDER — IBUPROFEN 800 MG PO TABS: 800.0000 mg | ORAL_TABLET | Freq: Three times a day (TID) | ORAL | 0 refills | Status: AC

## 2018-09-04 MED ORDER — LACTATED RINGERS IV SOLN
INTRAVENOUS | Status: DC
  Administered 2018-09-04: 08:00:00 via INTRAVENOUS

## 2018-09-04 MED ORDER — DEXAMETHASONE SODIUM PHOSPHATE 10 MG/ML IJ SOLN
INTRAMUSCULAR | Status: DC | PRN
  Administered 2018-09-04: 8 mg via INTRAVENOUS

## 2018-09-04 MED ORDER — GABAPENTIN 300 MG PO CAPS
ORAL_CAPSULE | ORAL | Status: AC
  Administered 2018-09-04: 09:00:00 300 mg via ORAL
  Filled 2018-09-04: qty 1

## 2018-09-04 MED ORDER — FENTANYL CITRATE (PF) 100 MCG/2ML IJ SOLN
25.0000 ug | INTRAMUSCULAR | Status: DC | PRN
  Administered 2018-09-04 (×3): 25 ug via INTRAVENOUS

## 2018-09-04 MED ORDER — MORPHINE SULFATE (PF) 2 MG/ML IV SOLN
1.0000 mg | INTRAVENOUS | Status: DC | PRN
  Administered 2018-09-04: 2 mg via INTRAVENOUS
  Filled 2018-09-04: qty 1

## 2018-09-04 MED ORDER — ROCURONIUM BROMIDE 50 MG/5ML IV SOLN
INTRAVENOUS | Status: AC
  Filled 2018-09-04: qty 1

## 2018-09-04 MED ORDER — LIDOCAINE-EPINEPHRINE 1 %-1:100000 IJ SOLN
INTRAMUSCULAR | Status: DC | PRN
  Administered 2018-09-04: 8 mL

## 2018-09-04 MED ORDER — MEPERIDINE HCL 50 MG/ML IJ SOLN: 6.2500 mg | INTRAMUSCULAR | Status: DC | PRN

## 2018-09-04 MED ORDER — ONDANSETRON HCL 4 MG/2ML IJ SOLN
INTRAMUSCULAR | Status: DC | PRN
  Administered 2018-09-04: 4 mg via INTRAVENOUS

## 2018-09-04 MED ORDER — LIDOCAINE HCL 2 % IJ SOLN
INTRAMUSCULAR | Status: AC
  Filled 2018-09-04: qty 10

## 2018-09-04 MED ORDER — ROCURONIUM BROMIDE 100 MG/10ML IV SOLN
INTRAVENOUS | Status: DC | PRN
  Administered 2018-09-04: 40 mg via INTRAVENOUS

## 2018-09-04 MED ORDER — IBUPROFEN 800 MG PO TABS
800.0000 mg | ORAL_TABLET | Freq: Three times a day (TID) | ORAL | Status: DC
  Administered 2018-09-04: 800 mg via ORAL
  Filled 2018-09-04: qty 1

## 2018-09-04 MED ORDER — ACETAMINOPHEN 500 MG PO TABS
ORAL_TABLET | ORAL | Status: AC
  Administered 2018-09-04: 1000 mg via ORAL
  Filled 2018-09-04: qty 2

## 2018-09-04 MED ORDER — ONDANSETRON HCL 4 MG PO TABS: 4.0000 mg | ORAL_TABLET | Freq: Four times a day (QID) | ORAL | Status: DC | PRN

## 2018-09-04 MED ORDER — PROMETHAZINE HCL 25 MG/ML IJ SOLN: 6.2500 mg | INTRAMUSCULAR | Status: DC | PRN

## 2018-09-04 MED ORDER — 0.9 % SODIUM CHLORIDE (POUR BTL) OPTIME
TOPICAL | Status: DC | PRN
  Administered 2018-09-04: 500 mL

## 2018-09-04 MED ORDER — MIDAZOLAM HCL 2 MG/2ML IJ SOLN
INTRAMUSCULAR | Status: AC
  Filled 2018-09-04: qty 2

## 2018-09-04 MED ORDER — ACETAMINOPHEN 500 MG PO TABS: 1000.0000 mg | ORAL_TABLET | Freq: Four times a day (QID) | ORAL | Status: DC

## 2018-09-04 MED ORDER — FENTANYL CITRATE (PF) 100 MCG/2ML IJ SOLN
INTRAMUSCULAR | Status: AC
  Administered 2018-09-04: 25 ug via INTRAVENOUS
  Filled 2018-09-04: qty 2

## 2018-09-04 MED ORDER — GABAPENTIN 300 MG PO CAPS: 300.0000 mg | ORAL_CAPSULE | Freq: Every day | ORAL | Status: DC

## 2018-09-04 MED ORDER — ACETAMINOPHEN 500 MG PO TABS
1000.0000 mg | ORAL_TABLET | ORAL | Status: AC
  Administered 2018-09-04: 1000 mg via ORAL

## 2018-09-04 MED ORDER — OXYCODONE HCL 5 MG PO TABS: 5.0000 mg | ORAL_TABLET | ORAL | Status: DC | PRN

## 2018-09-04 MED ORDER — LIDOCAINE HCL (CARDIAC) PF 100 MG/5ML IV SOSY
PREFILLED_SYRINGE | INTRAVENOUS | Status: DC | PRN
  Administered 2018-09-04: 80 mg via INTRAVENOUS

## 2018-09-04 MED ORDER — OXYCODONE HCL 5 MG PO TABS: 5.0000 mg | ORAL_TABLET | Freq: Once | ORAL | Status: DC | PRN

## 2018-09-04 MED ORDER — SUGAMMADEX SODIUM 200 MG/2ML IV SOLN
INTRAVENOUS | Status: DC | PRN
  Administered 2018-09-04: 150 mg via INTRAVENOUS

## 2018-09-04 MED ORDER — ONDANSETRON HCL 4 MG/2ML IJ SOLN
4.0000 mg | Freq: Four times a day (QID) | INTRAMUSCULAR | Status: DC | PRN
  Administered 2018-09-04: 15:00:00 4 mg via INTRAVENOUS
  Filled 2018-09-04: qty 2

## 2018-09-04 MED ORDER — MIDAZOLAM HCL 2 MG/2ML IJ SOLN
INTRAMUSCULAR | Status: DC | PRN
  Administered 2018-09-04: 2 mg via INTRAVENOUS

## 2018-09-04 MED ORDER — GABAPENTIN 300 MG PO CAPS
300.0000 mg | ORAL_CAPSULE | ORAL | Status: AC
  Administered 2018-09-04: 09:00:00 300 mg via ORAL

## 2018-09-04 SURGICAL SUPPLY — 35 items
BAG URINE DRAINAGE (UROLOGICAL SUPPLIES) ×3 IMPLANT
CANISTER SUCT 1200ML W/VALVE (MISCELLANEOUS) ×3 IMPLANT
CATH FOLEY 2WAY  5CC 16FR (CATHETERS) ×2
CATH ROBINSON RED A/P 16FR (CATHETERS) ×3 IMPLANT
CATH URTH 16FR FL 2W BLN LF (CATHETERS) ×1 IMPLANT
COVER WAND RF STERILE (DRAPES) ×3 IMPLANT
DRAPE PERI LITHO V/GYN (MISCELLANEOUS) ×3 IMPLANT
DRAPE SURG 17X11 SM STRL (DRAPES) ×3 IMPLANT
DRAPE SURG 17X23 STRL (DRAPES) ×3 IMPLANT
DRAPE UNDER BUTTOCK W/FLU (DRAPES) ×3 IMPLANT
ELECT REM PT RETURN 9FT ADLT (ELECTROSURGICAL) ×3
ELECTRODE REM PT RTRN 9FT ADLT (ELECTROSURGICAL) ×1 IMPLANT
GLOVE BIO SURGEON STRL SZ8 (GLOVE) ×3 IMPLANT
GOWN STRL REUS W/ TWL LRG LVL3 (GOWN DISPOSABLE) ×3 IMPLANT
GOWN STRL REUS W/ TWL XL LVL3 (GOWN DISPOSABLE) ×1 IMPLANT
GOWN STRL REUS W/TWL LRG LVL3 (GOWN DISPOSABLE) ×6
GOWN STRL REUS W/TWL XL LVL3 (GOWN DISPOSABLE) ×2
KIT TURNOVER CYSTO (KITS) ×3 IMPLANT
LABEL OR SOLS (LABEL) ×3 IMPLANT
NEEDLE HYPO 22GX1.5 SAFETY (NEEDLE) ×3 IMPLANT
PACK BASIN MINOR ARMC (MISCELLANEOUS) ×3 IMPLANT
PAD OB MATERNITY 4.3X12.25 (PERSONAL CARE ITEMS) ×3 IMPLANT
PAD PREP 24X41 OB/GYN DISP (PERSONAL CARE ITEMS) ×3 IMPLANT
SOL PREP PVP 2OZ (MISCELLANEOUS) ×3
SOLUTION PREP PVP 2OZ (MISCELLANEOUS) ×1 IMPLANT
SURGILUBE 2OZ TUBE FLIPTOP (MISCELLANEOUS) ×3 IMPLANT
SUT PDS 2-0 27IN (SUTURE) ×5 IMPLANT
SUT VIC AB 0 CT1 27 (SUTURE) ×6
SUT VIC AB 0 CT1 27XCR 8 STRN (SUTURE) ×3 IMPLANT
SUT VIC AB 0 CT1 36 (SUTURE) ×6 IMPLANT
SUT VIC AB 2-0 SH 27 (SUTURE) ×4
SUT VIC AB 2-0 SH 27XBRD (SUTURE) ×3 IMPLANT
SYR 10ML LL (SYRINGE) ×3 IMPLANT
SYR CONTROL 10ML (SYRINGE) ×3 IMPLANT
WATER STERILE IRR 1000ML POUR (IV SOLUTION) ×3 IMPLANT

## 2018-09-04 NOTE — Progress Notes (Signed)
Spoke with Dr. Ouida Sills who states patient may be discharged later on today but she should proceed to her hospital room. Order for foley to remain in place in PACU. Pt will go to room 346.

## 2018-09-04 NOTE — Progress Notes (Signed)
Called pt's husband, Shanon Brow, twice to update him with room number. No answer, no voice mail set up. Patient was able to get in contact with him from her cell phone. I then updated him and notified pt will be going to room 346. He verbalized understanding and is appreciative.
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
## 2018-09-04 NOTE — Op Note (Signed)
NAME: Misty George, Misty George MEDICAL RECORD ON:[id number] ACCOUNT [id number] DATE OF BIRTH:05-21-1975 FACILITY: [facility] LOCATION: [facility] PHYSICIAN:THOMAS Josefine Class, MD  OPERATIVE REPORT  DATE OF PROCEDURE:  09/04/2018  PREOPERATIVE DIAGNOSES: 1.  Menorrhagia. 2.  Fibroids.  POSTOPERATIVE DIAGNOSES: 1.  Menorrhagia. 2.  Fibroids.  PROCEDURE: 1.  Total vaginal hysterectomy. 2.  Bilateral salpingectomy.  ANESTHESIA:  General endotracheal anesthesia.  SURGEON:  Laverta Baltimore, MD  FIRST ASSISTANT:  Benjaman Kindler, MD  INDICATIONS:  A 43 year old gravida 1, para 1 patient with a long history of menorrhagia, not controlled with conservative therapy.  The patient elects for definitive surgery.  DESCRIPTION OF PROCEDURE:  After adequate general endotracheal anesthesia, the patient was placed in dorsal supine position, legs in the candy cane stirrups.  The patient's lower abdomen, perineum, and vagina were prepped and draped in normal sterile  fashion.  Timeout was performed.  Straight catheterization of the bladder yielded 50 mL of clear urine.  A weighted speculum was placed in the posterior vaginal vault, and the cervix was grasped with 2 thyroid tenacula.  Cervix was circumferentially  injected with 1% lidocaine with 1:100,000 epinephrine.  A direct posterior colpotomy incision was made upon entry into the posterior cul-de-sac.  A long-billed weighted speculum was placed.  The uterosacral ligaments were bilaterally clamped, transected,  suture ligated with 0 Vicryl suture.  The anterior cervix was circumscribed and opened with the Bovie.  The anterior cul-de-sac was entered sharply, and the Deaver retractor was placed within the anterior cul-de-sac to reflect the bladder anteriorly.   The cardinal ligaments were then bilaterally clamped, transected, suture ligated with 0 Vicryl suture.  The uterine arteries were then bilaterally clamped, transected, suture ligated with 0 Vicryl  suture.  Given the irregular shape of the uterus and the  girth, the cervix and uterus were cored out.  Several fibroids were brought through the central portion to decompress the uterus.  The cornua were bilaterally clamped, transected, and doubly ligated and the uterus was delivered.  Both the right and left  fallopian tubes were identified and clamped at the mesosalpinx, and distal portion of the fallopian tubes were removed and suture ligated with 0 Vicryl suture.  Good hemostasis was noted.  Ovaries appeared normal bilaterally.  The peritoneum was closed  with a 2-0 PDS suture in a pursestring fashion, and the vaginal vault was then closed in a running 0 Vicryl suture.  Additional figure-of-eight sutures were required to control vaginal cuff bleeding.  Good hemostasis was noted at the end of the case.  A  Foley catheter was placed.  Clear urine resulted with placement of the Foley catheter with an additional 50 mL of urine.  COMPLICATIONS:  There were no complications.  ESTIMATED BLOOD LOSS:  60 mL.  INTRAOPERATIVE FLUIDS:  600 mL.  URINE OUTPUT:  100 mL.  DISPOSITION:  The patient was taken to recovery room in good condition.  LN/NUANCE  D:09/04/2018 T:09/04/2018 JOB:[id number]

## 2018-09-04 NOTE — Transfer of Care (Signed)
Immediate Anesthesia Transfer of Care Note  Patient: Misty George  Procedure(s) Performed: HYSTERECTOMY VAGINAL,BILATERAL SALPINGECTOMY (N/A Vagina )  Patient Location: PACU  Anesthesia Type:General  Level of Consciousness: sedated  Airway & Oxygen Therapy: Patient Spontanous Breathing and Patient connected to face mask oxygen  Post-op Assessment: Report given to RN and Post -op Vital signs reviewed and stable  Post vital signs: Reviewed and stable  Last Vitals:  Vitals Value Taken Time  BP 105/82 09/04/18 1341  Temp 36.5 C 09/04/18 1340  Pulse 111 09/04/18 1341  Resp 7 09/04/18 1341  SpO2 99 % 09/04/18 1341  Vitals shown include unvalidated device data.  Last Pain:  Vitals:   09/04/18 1340  TempSrc: Temporal  PainSc: 0-No pain         Complications: No apparent anesthesia complications

## 2018-09-04 NOTE — Progress Notes (Signed)
Pt ready for TVH and bilateral salpingectomy . Neg HCG and Covid . All questions answered . LAbs reviewed . Proceed

## 2018-09-04 NOTE — Progress Notes (Signed)
Pt discharged home. Discharge instructions, prescriptions, and follow up appointments given to and reviewed with pt. Pt verbalized understanding. To be escorted out by staff.

## 2018-09-04 NOTE — Anesthesia Procedure Notes (Signed)
Procedure Name: Intubation Date/Time: 09/04/2018 11:40 AM Performed by: Hedda Slade, CRNA Pre-anesthesia Checklist: Patient identified, Patient being monitored, Timeout performed, Emergency Drugs available and Suction available Patient Re-evaluated:Patient Re-evaluated prior to induction Oxygen Delivery Method: Circle system utilized Preoxygenation: Pre-oxygenation with 100% oxygen Induction Type: IV induction Ventilation: Mask ventilation without difficulty Laryngoscope Size: Mac and 3 Grade View: Grade I Tube type: Oral Tube size: 7.0 mm Number of attempts: 1 Airway Equipment and Method: Stylet Placement Confirmation: ETT inserted through vocal cords under direct vision,  positive ETCO2 and breath sounds checked- equal and bilateral Secured at: 21 cm Tube secured with: Tape Dental Injury: Teeth and Oropharynx as per pre-operative assessment

## 2018-09-04 NOTE — Anesthesia Post-op Follow-up Note (Signed)
Anesthesia QCDR form completed.        

## 2018-09-04 NOTE — Brief Op Note (Signed)
09/04/2018  1:25 PM  PATIENT:  Misty George  43 y.o. female  PRE-OPERATIVE DIAGNOSIS:  Menorrhagia  POST-OPERATIVE DIAGNOSIS:  Menorrhagia  PROCEDURE:  Procedure(s): HYSTERECTOMY VAGINAL, bilateral salpingectomy (N/A)  SURGEON:  Surgeon(s) and Role:    * Miakoda Mcmillion, Gwen Her, MD - Primary    * Benjaman Kindler, MD - Assisting  PHYSICIAN ASSISTANT:   ASSISTANTS: none   ANESTHESIA:   general  EBL:  60 mL   BLOOD ADMINISTERED:none  DRAINS: Urinary Catheter (Foley)   LOCAL MEDICATIONS USED:  LIDOCAINE   SPECIMEN:  Source of Specimen:  cervix , uterus and bilateral tubes   DISPOSITION OF SPECIMEN:  PATHOLOGY  COUNTS:  YES  TOURNIQUET:  * No tourniquets in log *  DICTATION: .Other Dictation: Dictation Number verbal  PLAN OF CARE: Admit for overnight observation  PATIENT DISPOSITION:  PACU - hemodynamically stable.   Delay start of Pharmacological VTE agent (>24hrs) due to surgical blood loss or risk of bleeding: not applicable

## 2018-09-04 NOTE — Discharge Summary (Signed)
Physician Discharge Summary  Patient ID: Misty George MRN: [id number] DOB/AGE: Apr 23, 1975 43 y.o.  Admit date: 09/04/2018 Discharge date: 09/04/2018  Admission Diagnoses:menorrhagia  Discharge Diagnoses:  Active Problems:   Postoperative state   Discharged Condition: good  Hospital Course: pt underwent an uncomplicated TVH and bilateral salpingectomy  Post op pain in good control . Good urine output   Consults: None  Significant Diagnostic Studies: labs:  Results for orders placed or performed during the hospital encounter of 09/04/18 (from the past 72 hour(s))  Pregnancy, urine POC     Status: None   Collection Time: 09/04/18  7:54 AM  Result Value Ref Range   Preg Test, Ur NEGATIVE NEGATIVE    Comment:        THE SENSITIVITY OF THIS METHODOLOGY IS >24 mIU/mL   ABO/Rh     Status: None   Collection Time: 09/04/18  8:23 AM  Result Value Ref Range   ABO/RH(D)      O POS Performed at [facility], [address] [place], [place] [address]   CBC     Status: Abnormal   Collection Time: 09/04/18  3:32 PM  Result Value Ref Range   WBC 17.3 (H) 4.0 - 10.5 K/uL   RBC 4.03 3.87 - 5.11 MIL/uL   Hemoglobin 12.8 12.0 - 15.0 g/dL   HCT 38.3 36.0 - 46.0 %   MCV 95.0 80.0 - 100.0 fL   MCH 31.8 26.0 - 34.0 pg   MCHC 33.4 30.0 - 36.0 g/dL   RDW 13.8 11.5 - 15.5 %   Platelets 256 150 - 400 K/uL   nRBC 0.0 0.0 - 0.2 %    Comment: Performed at [facility], [address] [place], [place] [address]    Treatments: surgery: as above  Discharge Exam: Blood pressure (!) 126/92, pulse 96, temperature 98 F (36.7 C), resp. rate 20, height 5\' 3"  (1.6 m), weight 69.8 kg, SpO2 99 %. GI: soft, non-tender; bowel sounds normal; no masses,  no organomegaly  Disposition: Discharge disposition: 01-Home or Self Care       Discharge Instructions    Call MD for:  difficulty breathing, headache or visual disturbances   Complete by: As directed    Call MD for:   extreme fatigue   Complete by: As directed    Call MD for:  hives   Complete by: As directed    Call MD for:  persistant dizziness or light-headedness   Complete by: As directed    Call MD for:  persistant nausea and vomiting   Complete by: As directed    Call MD for:  redness, tenderness, or signs of infection (pain, swelling, redness, odor or green/yellow discharge around incision site)   Complete by: As directed    Call MD for:  severe uncontrolled pain   Complete by: As directed    Call MD for:  temperature >100.4   Complete by: As directed    Diet - low sodium heart healthy   Complete by: As directed    Increase activity slowly   Complete by: As directed      Allergies as of 09/04/2018      Reactions   Amoxicillin Hives      Medication List    STOP taking these medications   ADVIL PM PO   HYDROcodone-acetaminophen 5-325 MG tablet Commonly known as: NORCO/VICODIN   MULTIVITAMIN PO   naproxen 500 MG tablet Commonly known as: Naprosyn   naproxen sodium 220 MG  tablet Commonly known as: ALEVE     TAKE these medications   escitalopram 10 MG tablet Commonly known as: LEXAPRO Take 10 mg by mouth every morning.   gabapentin 300 MG capsule Commonly known as: NEURONTIN Take 300 mg by mouth 2 (two) times daily.   ibuprofen 800 MG tablet Commonly known as: ADVIL Take 1 tablet (800 mg total) by mouth every 8 (eight) hours. What changed:   when to take this  reasons to take this   omeprazole 20 MG tablet Commonly known as: PRILOSEC OTC Take 20 mg by mouth every morning.   oxyCODONE-acetaminophen 5-325 MG tablet Commonly known as: Percocet Take 1-2 tablets by mouth every 6 (six) hours as needed for severe pain.      Follow-up Information    Riggins Cisek, Gwen Her, MD Follow up in 2 week(s).   Specialty: Obstetrics and Gynecology Why: video visit  Contact information: [address] [place]  [phone]           Signed: Gwen Her Teaghan Formica 09/04/2018, 5:51 PM

## 2018-09-05 NOTE — Anesthesia Postprocedure Evaluation (Signed)
Anesthesia Post Note  Patient: Conservation officer, historic buildings  Procedure(s) Performed: HYSTERECTOMY VAGINAL,BILATERAL SALPINGECTOMY (N/A Vagina )  Patient location during evaluation: PACU Anesthesia Type: General Level of consciousness: awake and alert Pain management: pain level controlled Vital Signs Assessment: post-procedure vital signs reviewed and stable Respiratory status: spontaneous breathing, nonlabored ventilation, respiratory function stable and patient connected to nasal cannula oxygen Cardiovascular status: blood pressure returned to baseline and stable Postop Assessment: no apparent nausea or vomiting Anesthetic complications: no     Last Vitals:  Vitals:   09/04/18 1611 09/04/18 1724  BP: (!) 140/102 (!) 126/92  Pulse:    Resp:  20  Temp:  36.7 C  SpO2:  99%    Last Pain:  Vitals:   09/04/18 1734  TempSrc:   PainSc: 0-No pain                 Precious Haws Terisa Belardo

## 2018-09-06 LAB — SURGICAL PATHOLOGY

## 2018-09-07 ENCOUNTER — Other Ambulatory Visit: Payer: Self-pay

## 2018-09-07 ENCOUNTER — Encounter: Payer: Self-pay | Admitting: Emergency Medicine

## 2018-09-07 ENCOUNTER — Emergency Department: Payer: BC Managed Care – PPO

## 2018-09-07 ENCOUNTER — Emergency Department
Admission: EM | Admit: 2018-09-07 | Discharge: 2018-09-08 | Disposition: A | Discharge status: Home / Self Care | Payer: BC Managed Care – PPO | Attending: Emergency Medicine | Admitting: Emergency Medicine

## 2018-09-07 DIAGNOSIS — Y929 Unspecified place or not applicable: Secondary | ICD-10-CM | POA: Insufficient documentation

## 2018-09-07 DIAGNOSIS — Z23 Encounter for immunization: Secondary | ICD-10-CM | POA: Insufficient documentation

## 2018-09-07 DIAGNOSIS — W540XXA Bitten by dog, initial encounter: Secondary | ICD-10-CM | POA: Diagnosis not present

## 2018-09-07 DIAGNOSIS — I10 Essential (primary) hypertension: Secondary | ICD-10-CM | POA: Diagnosis not present

## 2018-09-07 DIAGNOSIS — Y999 Unspecified external cause status: Secondary | ICD-10-CM | POA: Insufficient documentation

## 2018-09-07 DIAGNOSIS — S91351A Open bite, right foot, initial encounter: Secondary | ICD-10-CM | POA: Insufficient documentation

## 2018-09-07 DIAGNOSIS — Z79899 Other long term (current) drug therapy: Secondary | ICD-10-CM | POA: Insufficient documentation

## 2018-09-07 DIAGNOSIS — Y939 Activity, unspecified: Secondary | ICD-10-CM | POA: Insufficient documentation

## 2018-09-07 DIAGNOSIS — Z87891 Personal history of nicotine dependence: Secondary | ICD-10-CM | POA: Diagnosis not present

## 2018-09-07 DIAGNOSIS — S99921A Unspecified injury of right foot, initial encounter: Secondary | ICD-10-CM | POA: Diagnosis present

## 2018-09-07 MED ORDER — CIPROFLOXACIN HCL 500 MG PO TABS: 500.0000 mg | ORAL_TABLET | Freq: Two times a day (BID) | ORAL | 0 refills | Status: AC

## 2018-09-07 MED ORDER — TETANUS-DIPHTH-ACELL PERTUSSIS 5-2.5-18.5 LF-MCG/0.5 IM SUSP
0.5000 mL | Freq: Once | INTRAMUSCULAR | Status: AC
  Administered 2018-09-08: 0.5 mL via INTRAMUSCULAR
  Filled 2018-09-07: qty 0.5

## 2018-09-07 MED ORDER — OXYCODONE-ACETAMINOPHEN 5-325 MG PO TABS: 1.0000 | ORAL_TABLET | ORAL | 0 refills | Status: DC | PRN

## 2018-09-07 MED ORDER — OXYCODONE-ACETAMINOPHEN 5-325 MG PO TABS
1.0000 | ORAL_TABLET | Freq: Once | ORAL | Status: AC
  Administered 2018-09-08: 1 via ORAL
  Filled 2018-09-07: qty 1

## 2018-09-07 MED ORDER — IBUPROFEN 600 MG PO TABS: 600.0000 mg | ORAL_TABLET | Freq: Three times a day (TID) | ORAL | 0 refills | Status: DC | PRN

## 2018-09-07 MED ORDER — CLINDAMYCIN HCL 300 MG PO CAPS: 300.0000 mg | ORAL_CAPSULE | Freq: Three times a day (TID) | ORAL | 0 refills | Status: AC

## 2018-09-07 MED ORDER — CIPROFLOXACIN HCL 500 MG PO TABS
500.0000 mg | ORAL_TABLET | Freq: Once | ORAL | Status: AC
  Administered 2018-09-08: 500 mg via ORAL
  Filled 2018-09-07: qty 1

## 2018-09-07 MED ORDER — CLINDAMYCIN HCL 150 MG PO CAPS
300.0000 mg | ORAL_CAPSULE | Freq: Once | ORAL | Status: AC
  Administered 2018-09-08: 300 mg via ORAL
  Filled 2018-09-07: qty 2

## 2018-09-07 MED ORDER — KETOROLAC TROMETHAMINE 30 MG/ML IJ SOLN
30.0000 mg | Freq: Once | INTRAMUSCULAR | Status: AC
  Administered 2018-09-08: 30 mg via INTRAMUSCULAR
  Filled 2018-09-07: qty 1

## 2018-09-07 NOTE — Discharge Instructions (Addendum)
Take antibiotics as prescribed: Clindamycin 300mg  three times daily x 10 days Cipro 500mg  twice daily x 7 days 2. You may take the pain medicines prescribed to you several days ago as needed for foot pain. 3. Keep wound clean & dry. 4. Return to the ER for worsening symptoms, persistent vomiting, difficulty breathing or other concerns.

## 2018-09-07 NOTE — ED Triage Notes (Signed)
Pt to triage via w/c with no distress noted, mask in place; pt reports being bit by her neighbor's pitbull PTA to rt foot; several punctures noted to top of foot with no active bleeding; st incident reported to [facility]; clean gauze dressing applied
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
## 2018-09-07 NOTE — ED Notes (Signed)
Patient does not remember last tetanus shot date

## 2018-09-08 NOTE — ED Notes (Signed)
Applied Bacitracin to R foot wounds, dressed with nonadherent gauze and 4x4, wrapped with Kerlex and ACE wrap. Sock applied to foot

## 2018-09-08 NOTE — ED Notes (Signed)
Patient provided with crutches, assisted in ambulating to POV; husband to drive patient home

## 2018-09-08 NOTE — ED Notes (Signed)
R foot soaking in NS and betadine

## 2018-09-28 ENCOUNTER — Other Ambulatory Visit: Payer: Self-pay | Admitting: Neurology

## 2018-09-28 ENCOUNTER — Other Ambulatory Visit (HOSPITAL_COMMUNITY): Payer: Self-pay | Admitting: Neurology

## 2018-09-28 DIAGNOSIS — R251 Tremor, unspecified: Secondary | ICD-10-CM

## 2018-10-05 ENCOUNTER — Ambulatory Visit
Admission: RE | Admit: 2018-10-05 | Discharge: 2018-10-05 | Disposition: A | Discharge status: Home / Self Care | Payer: BC Managed Care – PPO | Source: Ambulatory Visit | Attending: Neurology | Admitting: Neurology

## 2018-10-05 ENCOUNTER — Other Ambulatory Visit: Payer: Self-pay

## 2018-10-05 DIAGNOSIS — R251 Tremor, unspecified: Secondary | ICD-10-CM

## 2018-10-17 ENCOUNTER — Other Ambulatory Visit: Payer: Self-pay | Admitting: Neurology

## 2018-10-17 DIAGNOSIS — M79602 Pain in left arm: Secondary | ICD-10-CM

## 2018-10-17 DIAGNOSIS — R200 Anesthesia of skin: Secondary | ICD-10-CM

## 2018-10-27 ENCOUNTER — Ambulatory Visit
Admission: RE | Admit: 2018-10-27 | Discharge: 2018-10-27 | Disposition: A | Discharge status: Home / Self Care | Payer: BC Managed Care – PPO | Source: Ambulatory Visit | Attending: Neurology | Admitting: Neurology

## 2018-10-27 ENCOUNTER — Other Ambulatory Visit: Payer: Self-pay

## 2018-10-27 DIAGNOSIS — M79602 Pain in left arm: Secondary | ICD-10-CM | POA: Diagnosis not present

## 2018-10-27 DIAGNOSIS — R200 Anesthesia of skin: Secondary | ICD-10-CM | POA: Insufficient documentation

## 2018-12-26 ENCOUNTER — Other Ambulatory Visit: Payer: Self-pay | Admitting: Family Medicine

## 2018-12-26 ENCOUNTER — Other Ambulatory Visit: Payer: Self-pay | Admitting: Physical Medicine and Rehabilitation

## 2018-12-26 DIAGNOSIS — M5412 Radiculopathy, cervical region: Secondary | ICD-10-CM

## 2018-12-29 ENCOUNTER — Ambulatory Visit
Admission: RE | Admit: 2018-12-29 | Discharge: 2018-12-29 | Disposition: A | Discharge status: Home / Self Care | Payer: BC Managed Care – PPO | Source: Ambulatory Visit | Attending: Physical Medicine and Rehabilitation | Admitting: Physical Medicine and Rehabilitation

## 2018-12-29 ENCOUNTER — Other Ambulatory Visit: Payer: Self-pay

## 2018-12-29 DIAGNOSIS — M5412 Radiculopathy, cervical region: Secondary | ICD-10-CM

## 2018-12-29 MED ORDER — TRIAMCINOLONE ACETONIDE 40 MG/ML IJ SUSP (RADIOLOGY)
60.0000 mg | Freq: Once | INTRAMUSCULAR | Status: AC
  Administered 2018-12-29: 12:00:00 60 mg via EPIDURAL

## 2018-12-29 MED ORDER — IOPAMIDOL (ISOVUE-M 300) INJECTION 61%
1.0000 mL | Freq: Once | INTRAMUSCULAR | Status: AC | PRN
  Administered 2018-12-29: 12:00:00 1 mL via EPIDURAL

## 2018-12-29 NOTE — Discharge Instructions (Signed)

## 2019-07-23 ENCOUNTER — Other Ambulatory Visit: Payer: Self-pay | Admitting: Family Medicine

## 2019-07-23 DIAGNOSIS — M5412 Radiculopathy, cervical region: Secondary | ICD-10-CM

## 2019-08-03 ENCOUNTER — Other Ambulatory Visit: Payer: Self-pay

## 2019-08-03 ENCOUNTER — Ambulatory Visit
Admission: RE | Admit: 2019-08-03 | Discharge: 2019-08-03 | Disposition: A | Discharge status: Home / Self Care | Payer: BC Managed Care – PPO | Source: Ambulatory Visit | Attending: Family Medicine | Admitting: Family Medicine

## 2019-08-03 ENCOUNTER — Other Ambulatory Visit: Payer: BC Managed Care – PPO

## 2019-08-03 DIAGNOSIS — M5412 Radiculopathy, cervical region: Secondary | ICD-10-CM

## 2019-08-03 MED ORDER — TRIAMCINOLONE ACETONIDE 40 MG/ML IJ SUSP (RADIOLOGY)
60.0000 mg | Freq: Once | INTRAMUSCULAR | Status: AC
  Administered 2019-08-03: 60 mg via EPIDURAL

## 2019-08-03 MED ORDER — IOPAMIDOL (ISOVUE-M 300) INJECTION 61%
1.0000 mL | Freq: Once | INTRAMUSCULAR | Status: AC
  Administered 2019-08-03: 1 mL via EPIDURAL

## 2019-08-03 NOTE — Discharge Instructions (Signed)

## 2019-08-15 ENCOUNTER — Other Ambulatory Visit: Payer: Self-pay | Admitting: Family Medicine

## 2019-08-15 DIAGNOSIS — M5412 Radiculopathy, cervical region: Secondary | ICD-10-CM

## 2019-08-28 ENCOUNTER — Inpatient Hospital Stay: Admission: RE | Admit: 2019-08-28 | Payer: BC Managed Care – PPO | Source: Ambulatory Visit

## 2019-09-17 ENCOUNTER — Inpatient Hospital Stay: Admission: RE | Admit: 2019-09-17 | Payer: BC Managed Care – PPO | Source: Ambulatory Visit

## 2019-09-20 ENCOUNTER — Other Ambulatory Visit: Payer: Self-pay

## 2019-09-20 ENCOUNTER — Ambulatory Visit
Admission: RE | Admit: 2019-09-20 | Discharge: 2019-09-20 | Disposition: A | Discharge status: Home / Self Care | Payer: BC Managed Care – PPO | Source: Ambulatory Visit | Attending: Family Medicine | Admitting: Family Medicine

## 2019-09-20 DIAGNOSIS — M5412 Radiculopathy, cervical region: Secondary | ICD-10-CM

## 2019-09-20 MED ORDER — TRIAMCINOLONE ACETONIDE 40 MG/ML IJ SUSP (RADIOLOGY)
60.0000 mg | Freq: Once | INTRAMUSCULAR | Status: AC
  Administered 2019-09-20: 60 mg via EPIDURAL

## 2019-09-20 MED ORDER — IOPAMIDOL (ISOVUE-M 300) INJECTION 61%
1.0000 mL | Freq: Once | INTRAMUSCULAR | Status: AC | PRN
  Administered 2019-09-20: 1 mL via EPIDURAL

## 2019-09-20 NOTE — Discharge Instructions (Signed)

## 2019-09-21 ENCOUNTER — Telehealth: Payer: Self-pay

## 2019-09-21 NOTE — Telephone Encounter (Signed)
Pt called into [facility] this morning reporting she has had "incontintines and dizzy spells" since she had her cervical injection done yesterday (09/20/2019) by Dr. Jeralyn Ruths. Pt also reports she "passed out" and had a "low Blood pressure" after her injection yesterday. I spoke to Dr. Jobe Igo, the Radiologist here today, concerning the pt and her complaints. Dr. Jobe Igo advised the pt to go to the Emergency Department for evaluation. I called and spoke directly to the patient and advised her to go to the Emergency Room for Evaluation and further workup. Pt verbalized understanding.

## 2019-12-17 DEATH — deceased

## 2020-02-03 IMAGING — MR MRI CERVICAL SPINE WITHOUT CONTRAST
5 series · 40 of 48 positions shown · non-contrast
Comparison: Plain film cervical spine 05/16/2015.

CLINICAL DATA: Left arm and hand pain for 6 months. No known
injury. Chronic neck tremors.

EXAM:
MRI CERVICAL SPINE WITHOUT CONTRAST
TECHNIQUE: Multiplanar, multisequence MR imaging of the cervical spine was
performed. No intravenous contrast was administered.

[Series 5: T2 · sagittal · 3.0mm · 0.62mm/px · 6 of 15 slices shown (1 of 2)]
[im 1/15]
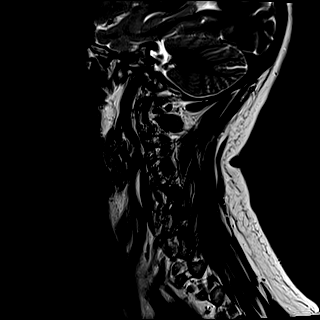
[im 3/15]
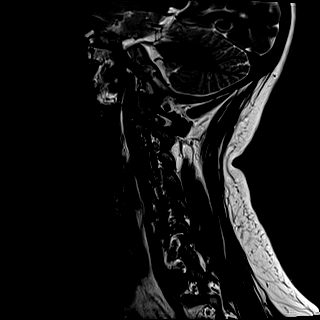
[im 6/15]
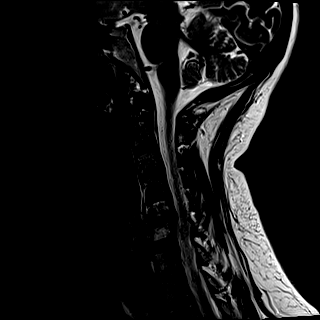
[im 9/15]
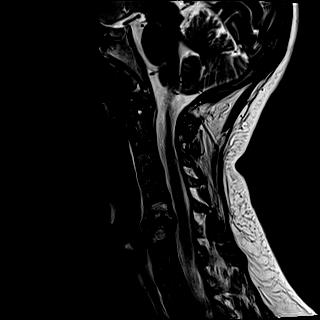
[im 12/15]
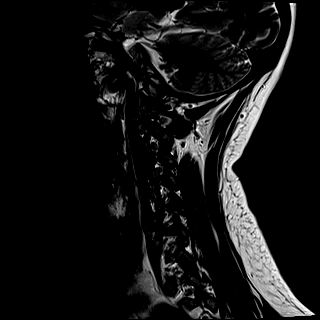
[im 15/15]
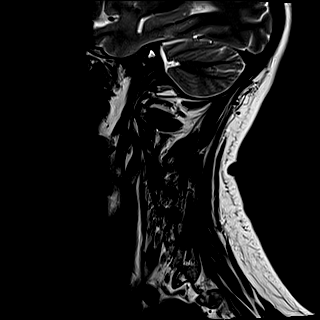

[Series 6: FLAIR · sagittal · 3.0mm · 0.78mm/px · 7 of 15 slices shown]
[im 1/15]
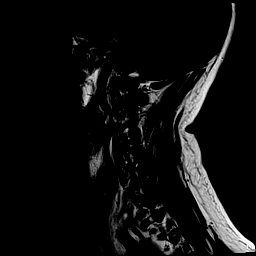
[im 3/15]
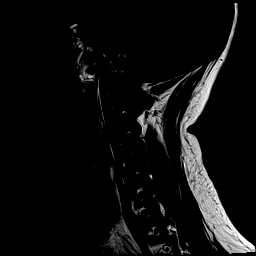
[im 5/15]
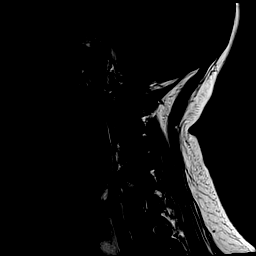
[im 8/15]
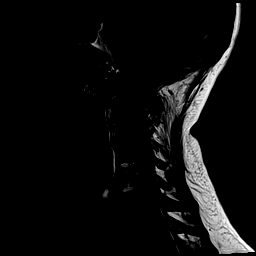
[im 10/15]
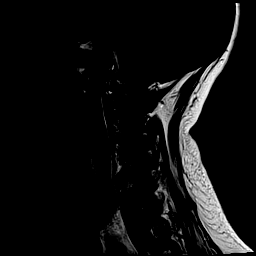
[im 12/15]
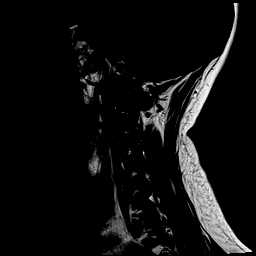
[im 15/15]
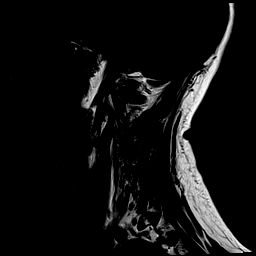

[Series 7: STIR · sagittal · 3.0mm · 0.62mm/px · 7 of 15 slices shown]
[im 1/15]
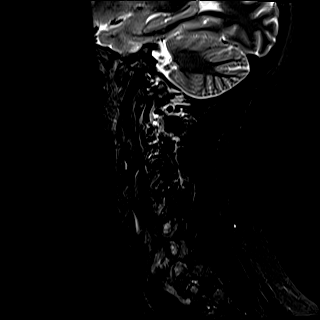
[im 3/15]
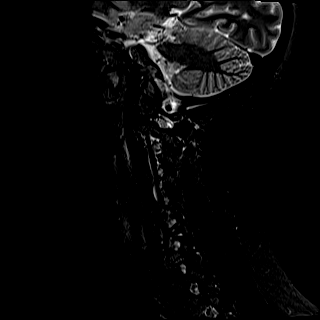
[im 5/15]
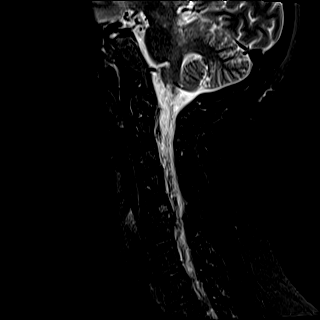
[im 8/15]
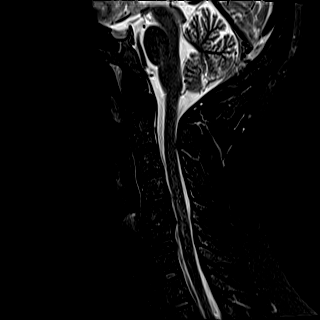
[im 10/15]
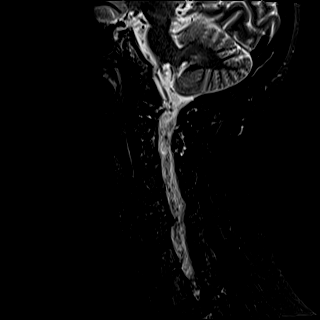
[im 12/15]
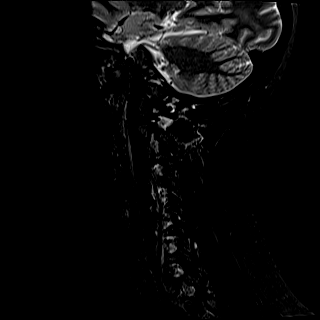
[im 15/15]
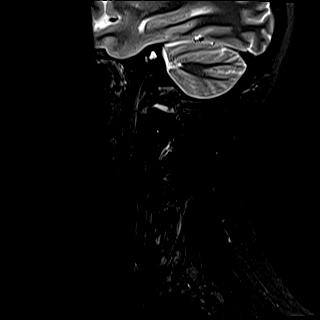

[Series 9: ax mpgr · axial · 3.0mm · 0.35mm/px · z∈[-212,-117]mm · 8 of 29 slices shown]
[im 1/29]
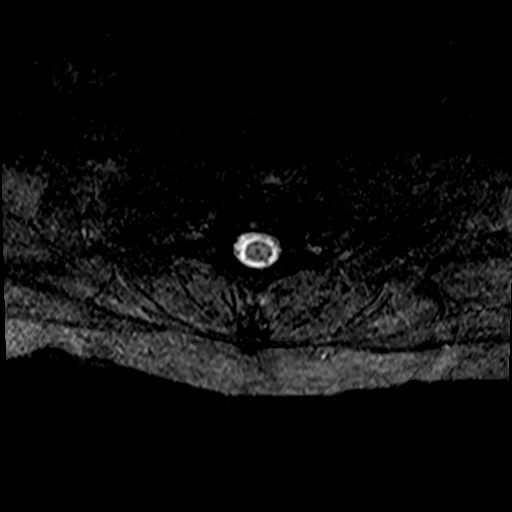
[im 5/29]
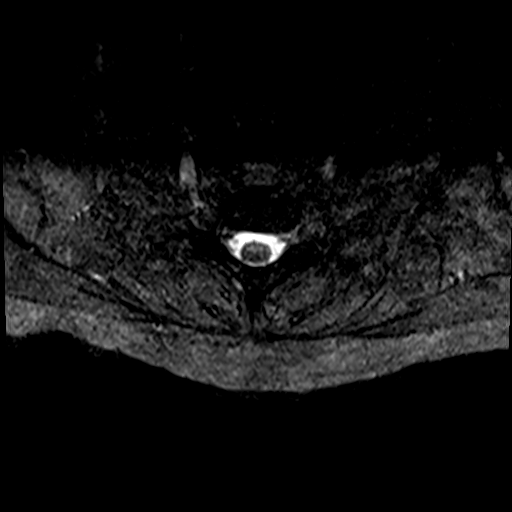
[im 9/29]
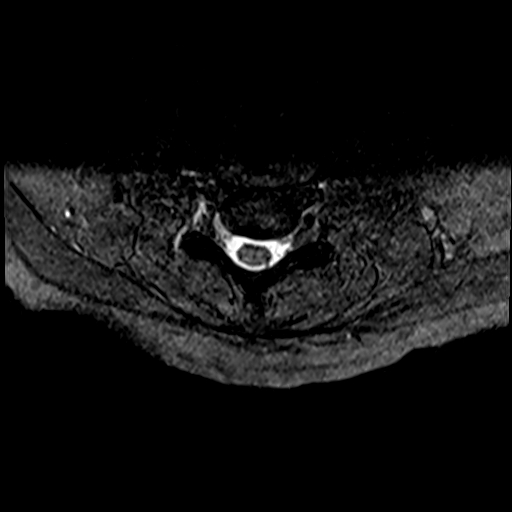
[im 13/29]
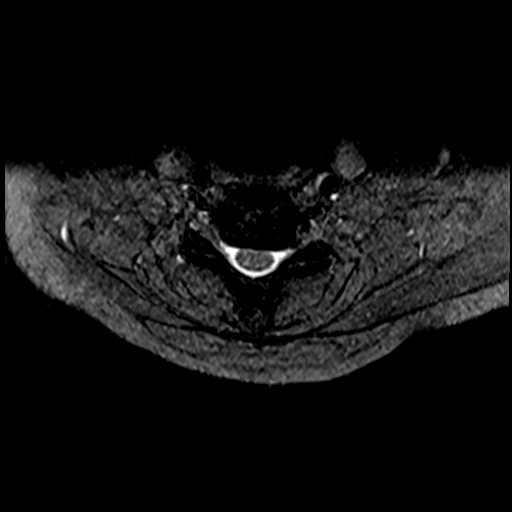
[im 16/29]
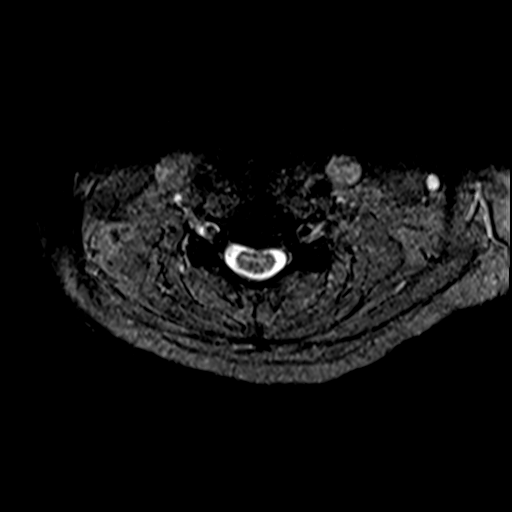
[im 20/29]
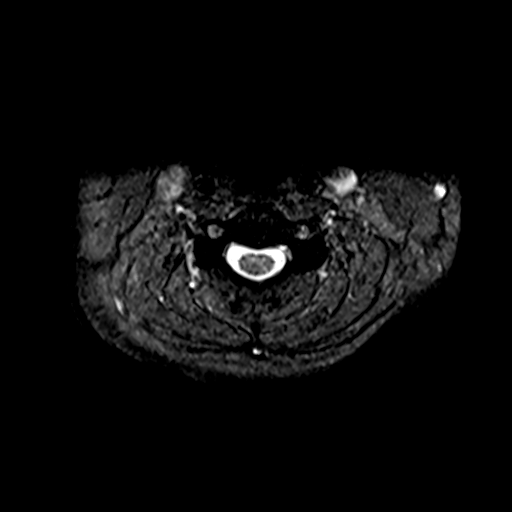
[im 24/29]
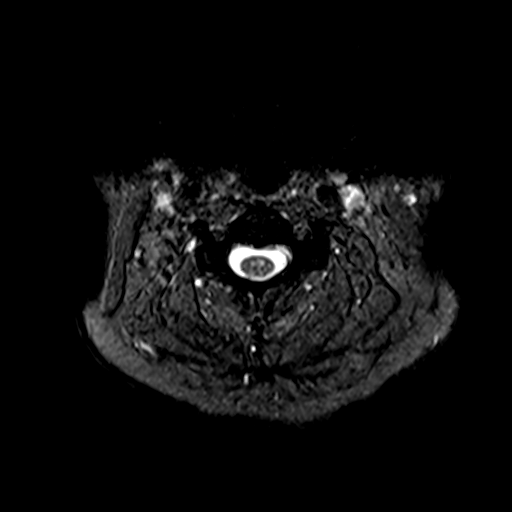
[im 29/29]
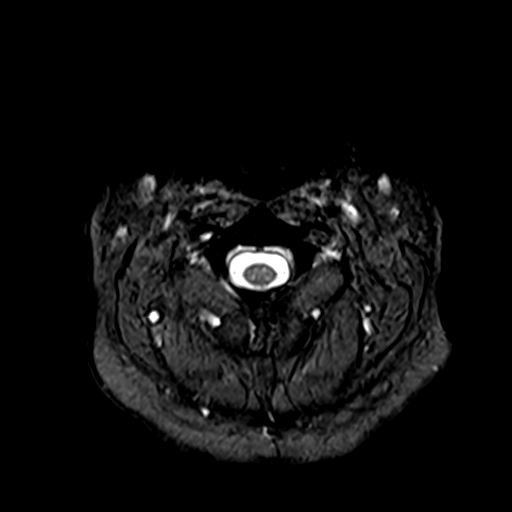

[Series 10: T2 · axial · 3.0mm · 0.70mm/px · z∈[-212,-117]mm · 12 of 29 slices shown (2 of 2)]
[im 1/29]
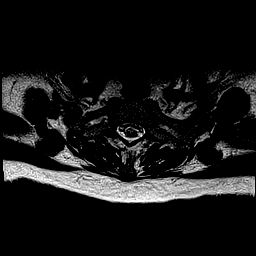
[im 3/29]
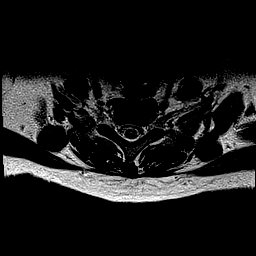
[im 5/29]
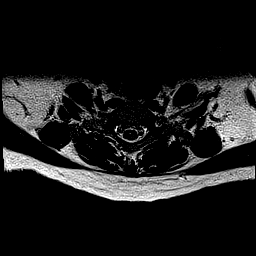
[im 7/29]
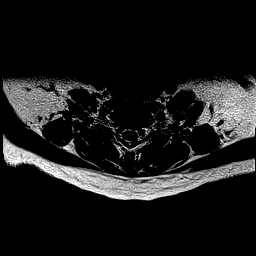
[im 9/29]
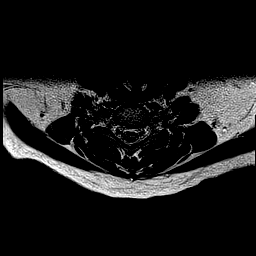
[im 11/29]
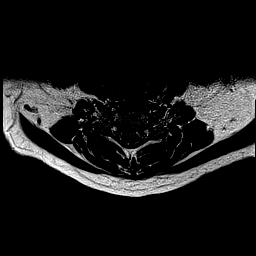
[im 13/29]
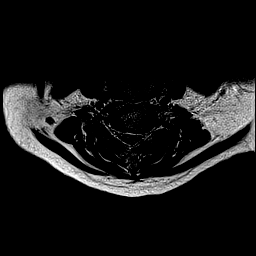
[im 16/29]
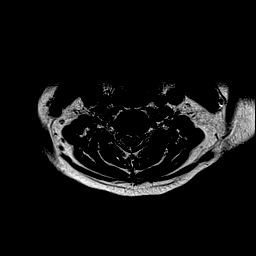
[im 18/29]
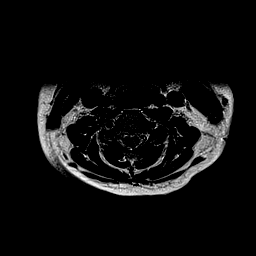
[im 20/29]
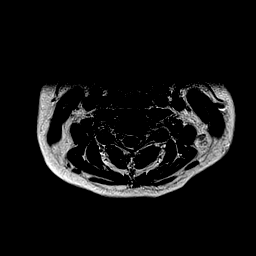
[im 24/29]
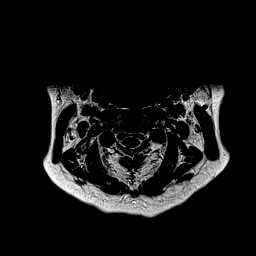
[im 29/29]
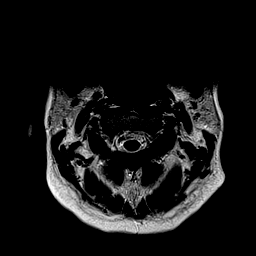

[40 of 48 positions shown; findings below may reference images not displayed]

FINDINGS: Alignment: Maintained with straightening of lordosis noted.

Vertebrae: No fracture, evidence of discitis, or bone lesion.
Degenerative endplate signal change C5-6 is seen.

Cord: Normal signal throughout.

Posterior Fossa, vertebral arteries, paraspinal tissues: Negative.

Disc levels:

C2-3: Negative.

C3-4: Minimal disc bulge left.  No stenosis.

C4-5: Very shallow central protrusion without stenosis.

C5-6: There is loss of disc space height with a shallow bulge,
endplate spur and uncovertebral disease. The ventral thecal sac is
nearly effaced. Mild foraminal narrowing is more notable on the
left.

C6-7: Very small central protrusion without stenosis.

C7-T1: Negative.
IMPRESSION: Disc bulge and endplate spur at C5-6 nearly efface the ventral
thecal sac but there is no cord deformity. Mild bilateral foraminal
narrowing at this level due to uncovertebral disease is more notable
on the left.

Small disc protrusion C4-5 and C6-7 without stenosis at either
level.
